# Patient Record
Sex: Male | Born: 1935
Health system: Southern US, Community
[De-identification: ages and names within clinical notes are randomized; demographics above are authoritative.]

---

## 2012-01-06 ENCOUNTER — Emergency Department: Payer: Self-pay | Admitting: Emergency Medicine

## 2012-07-02 ENCOUNTER — Observation Stay: Payer: Self-pay | Admitting: Internal Medicine

## 2012-07-02 LAB — BASIC METABOLIC PANEL
Calcium, Total: 9 mg/dL (ref 8.5–10.1)
Chloride: 106 mmol/L (ref 98–107)
Co2: 25 mmol/L (ref 21–32)
Creatinine: 0.84 mg/dL (ref 0.60–1.30)
Potassium: 3.5 mmol/L (ref 3.5–5.1)
Sodium: 141 mmol/L (ref 136–145)

## 2012-07-02 LAB — CBC
HCT: 36.6 % — ABNORMAL LOW (ref 40.0–52.0)
HGB: 12 g/dL — ABNORMAL LOW (ref 13.0–18.0)
MCH: 31.1 pg (ref 26.0–34.0)
MCHC: 32.8 g/dL (ref 32.0–36.0)
MCV: 95 fL (ref 80–100)
Platelet: 163 10*3/uL (ref 150–440)
RBC: 3.85 10*6/uL — ABNORMAL LOW (ref 4.40–5.90)
RDW: 13.4 % (ref 11.5–14.5)
WBC: 8 10*3/uL (ref 3.8–10.6)

## 2012-07-02 LAB — TROPONIN I: Troponin-I: 0.02 ng/mL

## 2012-07-03 LAB — CK TOTAL AND CKMB (NOT AT ARMC)
CK, Total: 61 U/L (ref 35–232)
CK, Total: 72 U/L (ref 35–232)
CK-MB: 1.7 ng/mL (ref 0.5–3.6)

## 2012-07-03 LAB — CBC WITH DIFFERENTIAL/PLATELET
Basophil %: 0.8 %
Eosinophil %: 5.9 %
MCHC: 33.9 g/dL (ref 32.0–36.0)
Monocyte %: 8.4 %
Neutrophil %: 58.5 %
Platelet: 141 10*3/uL — ABNORMAL LOW (ref 150–440)
RBC: 3.44 10*6/uL — ABNORMAL LOW (ref 4.40–5.90)
RDW: 13 % (ref 11.5–14.5)

## 2012-07-03 LAB — BASIC METABOLIC PANEL
Anion Gap: 6 — ABNORMAL LOW (ref 7–16)
BUN: 20 mg/dL — ABNORMAL HIGH (ref 7–18)
Calcium, Total: 8.2 mg/dL — ABNORMAL LOW (ref 8.5–10.1)
Chloride: 110 mmol/L — ABNORMAL HIGH (ref 98–107)
Co2: 28 mmol/L (ref 21–32)
Creatinine: 0.89 mg/dL (ref 0.60–1.30)
EGFR (African American): >60
Osmolality: 289 (ref 275–301)
Potassium: 3.3 mmol/L — ABNORMAL LOW (ref 3.5–5.1)

## 2012-07-03 LAB — TROPONIN I: Troponin-I: 0.03 ng/mL

## 2014-05-01 ENCOUNTER — Emergency Department: Payer: Self-pay | Admitting: Emergency Medicine

## 2014-05-06 DIAGNOSIS — I951 Orthostatic hypotension: Secondary | ICD-10-CM

## 2014-05-06 DIAGNOSIS — F39 Unspecified mood [affective] disorder: Secondary | ICD-10-CM

## 2014-05-06 DIAGNOSIS — F0150 Vascular dementia without behavioral disturbance: Secondary | ICD-10-CM

## 2014-05-06 DIAGNOSIS — M8448XA Pathological fracture, other site, initial encounter for fracture: Secondary | ICD-10-CM

## 2014-05-06 DIAGNOSIS — E039 Hypothyroidism, unspecified: Secondary | ICD-10-CM

## 2014-05-06 DIAGNOSIS — I2510 Atherosclerotic heart disease of native coronary artery without angina pectoris: Secondary | ICD-10-CM

## 2014-05-26 DIAGNOSIS — F323 Major depressive disorder, single episode, severe with psychotic features: Secondary | ICD-10-CM

## 2014-06-03 DIAGNOSIS — I951 Orthostatic hypotension: Secondary | ICD-10-CM

## 2014-06-03 DIAGNOSIS — S32009A Unspecified fracture of unspecified lumbar vertebra, initial encounter for closed fracture: Secondary | ICD-10-CM

## 2014-06-03 DIAGNOSIS — I25119 Atherosclerotic heart disease of native coronary artery with unspecified angina pectoris: Secondary | ICD-10-CM

## 2014-06-03 DIAGNOSIS — F0390 Unspecified dementia without behavioral disturbance: Secondary | ICD-10-CM

## 2014-06-03 DIAGNOSIS — I10 Essential (primary) hypertension: Secondary | ICD-10-CM

## 2014-06-03 DIAGNOSIS — F39 Unspecified mood [affective] disorder: Secondary | ICD-10-CM

## 2014-06-16 DIAGNOSIS — F331 Major depressive disorder, recurrent, moderate: Secondary | ICD-10-CM

## 2014-06-16 DIAGNOSIS — R634 Abnormal weight loss: Secondary | ICD-10-CM

## 2014-07-02 DIAGNOSIS — I951 Orthostatic hypotension: Secondary | ICD-10-CM

## 2014-07-02 DIAGNOSIS — I10 Essential (primary) hypertension: Secondary | ICD-10-CM

## 2014-07-02 DIAGNOSIS — E039 Hypothyroidism, unspecified: Secondary | ICD-10-CM

## 2014-07-02 DIAGNOSIS — I2510 Atherosclerotic heart disease of native coronary artery without angina pectoris: Secondary | ICD-10-CM

## 2014-07-02 DIAGNOSIS — F0150 Vascular dementia without behavioral disturbance: Secondary | ICD-10-CM

## 2014-07-02 DIAGNOSIS — F508 Other eating disorders: Secondary | ICD-10-CM

## 2014-07-02 DIAGNOSIS — S32009A Unspecified fracture of unspecified lumbar vertebra, initial encounter for closed fracture: Secondary | ICD-10-CM

## 2014-07-02 DIAGNOSIS — F323 Major depressive disorder, single episode, severe with psychotic features: Secondary | ICD-10-CM

## 2014-07-09 ENCOUNTER — Telehealth: Payer: Self-pay

## 2014-07-09 NOTE — Telephone Encounter (Signed)
Mrs James Griffin has questions about pt's medications and I transferred call to memory care [phone] and Mrs James Griffin is [facility] with [facility].

## 2014-07-28 DIAGNOSIS — H1013 Acute atopic conjunctivitis, bilateral: Secondary | ICD-10-CM

## 2014-08-02 ENCOUNTER — Telehealth: Payer: Self-pay

## 2014-08-02 NOTE — Telephone Encounter (Signed)
Dr Alphonsus Siasletvak signed Rx and has been faxed

## 2014-08-02 NOTE — Telephone Encounter (Signed)
Ok to change

## 2014-08-02 NOTE — Telephone Encounter (Signed)
James Griffin with [employer] left v/m; pt is at [facility] and has been taking ritalin 10 mg taking 2 tabs po bid. [employer] ins will not approve quantity and request Nicki Reaperegina Baity NP change to Ritalin 20 mg taking one tab po bid.Please advise.

## 2014-08-05 ENCOUNTER — Other Ambulatory Visit: Payer: Self-pay | Admitting: Internal Medicine

## 2014-09-08 DIAGNOSIS — F323 Major depressive disorder, single episode, severe with psychotic features: Secondary | ICD-10-CM | POA: Diagnosis not present

## 2014-09-08 DIAGNOSIS — E785 Hyperlipidemia, unspecified: Secondary | ICD-10-CM

## 2014-09-08 DIAGNOSIS — E039 Hypothyroidism, unspecified: Secondary | ICD-10-CM | POA: Diagnosis not present

## 2014-09-08 DIAGNOSIS — F508 Other eating disorders: Secondary | ICD-10-CM

## 2014-09-08 DIAGNOSIS — I951 Orthostatic hypotension: Secondary | ICD-10-CM

## 2014-09-08 DIAGNOSIS — F0150 Vascular dementia without behavioral disturbance: Secondary | ICD-10-CM

## 2014-09-08 DIAGNOSIS — I2510 Atherosclerotic heart disease of native coronary artery without angina pectoris: Secondary | ICD-10-CM | POA: Diagnosis not present

## 2014-09-08 DIAGNOSIS — I10 Essential (primary) hypertension: Secondary | ICD-10-CM | POA: Diagnosis not present

## 2014-09-21 ENCOUNTER — Ambulatory Visit: Payer: Self-pay | Admitting: Internal Medicine

## 2014-10-27 DIAGNOSIS — I10 Essential (primary) hypertension: Secondary | ICD-10-CM | POA: Diagnosis not present

## 2014-10-27 DIAGNOSIS — S32000S Wedge compression fracture of unspecified lumbar vertebra, sequela: Secondary | ICD-10-CM | POA: Diagnosis not present

## 2014-10-27 DIAGNOSIS — E039 Hypothyroidism, unspecified: Secondary | ICD-10-CM | POA: Diagnosis not present

## 2014-10-27 DIAGNOSIS — E785 Hyperlipidemia, unspecified: Secondary | ICD-10-CM

## 2014-10-27 DIAGNOSIS — F0150 Vascular dementia without behavioral disturbance: Secondary | ICD-10-CM

## 2014-10-27 DIAGNOSIS — F508 Other eating disorders: Secondary | ICD-10-CM

## 2014-10-27 DIAGNOSIS — I2510 Atherosclerotic heart disease of native coronary artery without angina pectoris: Secondary | ICD-10-CM | POA: Diagnosis not present

## 2014-11-03 DIAGNOSIS — R509 Fever, unspecified: Secondary | ICD-10-CM | POA: Diagnosis not present

## 2014-11-03 DIAGNOSIS — R05 Cough: Secondary | ICD-10-CM | POA: Diagnosis not present

## 2014-11-03 DIAGNOSIS — R062 Wheezing: Secondary | ICD-10-CM | POA: Diagnosis not present

## 2014-11-05 DIAGNOSIS — R05 Cough: Secondary | ICD-10-CM | POA: Diagnosis not present

## 2014-11-05 DIAGNOSIS — R062 Wheezing: Secondary | ICD-10-CM | POA: Diagnosis not present

## 2014-11-09 ENCOUNTER — Inpatient Hospital Stay: Payer: Self-pay | Admitting: Specialist

## 2014-11-09 DIAGNOSIS — J209 Acute bronchitis, unspecified: Secondary | ICD-10-CM | POA: Diagnosis not present

## 2014-11-09 DIAGNOSIS — R05 Cough: Secondary | ICD-10-CM | POA: Diagnosis not present

## 2014-11-09 DIAGNOSIS — I129 Hypertensive chronic kidney disease with stage 1 through stage 4 chronic kidney disease, or unspecified chronic kidney disease: Secondary | ICD-10-CM | POA: Diagnosis not present

## 2014-11-09 DIAGNOSIS — R0602 Shortness of breath: Secondary | ICD-10-CM | POA: Diagnosis not present

## 2014-11-09 DIAGNOSIS — Z66 Do not resuscitate: Secondary | ICD-10-CM | POA: Diagnosis not present

## 2014-11-09 DIAGNOSIS — I248 Other forms of acute ischemic heart disease: Secondary | ICD-10-CM | POA: Diagnosis not present

## 2014-11-09 DIAGNOSIS — I214 Non-ST elevation (NSTEMI) myocardial infarction: Secondary | ICD-10-CM | POA: Diagnosis not present

## 2014-11-09 DIAGNOSIS — N189 Chronic kidney disease, unspecified: Secondary | ICD-10-CM | POA: Diagnosis not present

## 2014-11-09 DIAGNOSIS — N179 Acute kidney failure, unspecified: Secondary | ICD-10-CM | POA: Diagnosis not present

## 2014-11-09 DIAGNOSIS — J189 Pneumonia, unspecified organism: Secondary | ICD-10-CM | POA: Diagnosis not present

## 2014-11-09 DIAGNOSIS — N289 Disorder of kidney and ureter, unspecified: Secondary | ICD-10-CM | POA: Diagnosis not present

## 2014-11-09 DIAGNOSIS — R7989 Other specified abnormal findings of blood chemistry: Secondary | ICD-10-CM | POA: Diagnosis not present

## 2014-11-09 DIAGNOSIS — J9601 Acute respiratory failure with hypoxia: Secondary | ICD-10-CM | POA: Diagnosis not present

## 2014-11-09 DIAGNOSIS — I10 Essential (primary) hypertension: Secondary | ICD-10-CM | POA: Diagnosis not present

## 2014-11-09 DIAGNOSIS — R509 Fever, unspecified: Secondary | ICD-10-CM | POA: Diagnosis not present

## 2014-11-09 DIAGNOSIS — F0390 Unspecified dementia without behavioral disturbance: Secondary | ICD-10-CM | POA: Diagnosis not present

## 2014-11-09 DIAGNOSIS — E860 Dehydration: Secondary | ICD-10-CM | POA: Diagnosis not present

## 2014-11-11 LAB — CREATININE, SERUM
Creatinine: 0.93 mg/dL
EGFR (Non-African Amer.): >60

## 2014-11-12 LAB — CREATININE, SERUM
Creatinine: 0.76 mg/dL
EGFR (African American): >60

## 2014-11-16 LAB — CULTURE, BLOOD (SINGLE)

## 2014-11-19 DIAGNOSIS — J189 Pneumonia, unspecified organism: Secondary | ICD-10-CM | POA: Diagnosis not present

## 2014-11-19 DIAGNOSIS — J9601 Acute respiratory failure with hypoxia: Secondary | ICD-10-CM | POA: Diagnosis not present

## 2014-11-19 DIAGNOSIS — K219 Gastro-esophageal reflux disease without esophagitis: Secondary | ICD-10-CM | POA: Diagnosis not present

## 2014-11-19 DIAGNOSIS — I10 Essential (primary) hypertension: Secondary | ICD-10-CM | POA: Diagnosis not present

## 2014-11-19 DIAGNOSIS — I2510 Atherosclerotic heart disease of native coronary artery without angina pectoris: Secondary | ICD-10-CM | POA: Diagnosis not present

## 2014-11-22 DIAGNOSIS — J189 Pneumonia, unspecified organism: Secondary | ICD-10-CM | POA: Diagnosis not present

## 2014-11-22 DIAGNOSIS — K219 Gastro-esophageal reflux disease without esophagitis: Secondary | ICD-10-CM | POA: Diagnosis not present

## 2014-11-22 DIAGNOSIS — J9601 Acute respiratory failure with hypoxia: Secondary | ICD-10-CM | POA: Diagnosis not present

## 2014-11-22 DIAGNOSIS — I2510 Atherosclerotic heart disease of native coronary artery without angina pectoris: Secondary | ICD-10-CM | POA: Diagnosis not present

## 2014-11-22 DIAGNOSIS — I10 Essential (primary) hypertension: Secondary | ICD-10-CM | POA: Diagnosis not present

## 2014-11-23 DIAGNOSIS — I2510 Atherosclerotic heart disease of native coronary artery without angina pectoris: Secondary | ICD-10-CM | POA: Diagnosis not present

## 2014-11-23 DIAGNOSIS — I10 Essential (primary) hypertension: Secondary | ICD-10-CM | POA: Diagnosis not present

## 2014-11-23 DIAGNOSIS — K219 Gastro-esophageal reflux disease without esophagitis: Secondary | ICD-10-CM | POA: Diagnosis not present

## 2014-11-23 DIAGNOSIS — J189 Pneumonia, unspecified organism: Secondary | ICD-10-CM | POA: Diagnosis not present

## 2014-11-23 DIAGNOSIS — J9601 Acute respiratory failure with hypoxia: Secondary | ICD-10-CM | POA: Diagnosis not present

## 2014-11-25 DIAGNOSIS — K219 Gastro-esophageal reflux disease without esophagitis: Secondary | ICD-10-CM | POA: Diagnosis not present

## 2014-11-25 DIAGNOSIS — I2510 Atherosclerotic heart disease of native coronary artery without angina pectoris: Secondary | ICD-10-CM | POA: Diagnosis not present

## 2014-11-25 DIAGNOSIS — J189 Pneumonia, unspecified organism: Secondary | ICD-10-CM | POA: Diagnosis not present

## 2014-11-25 DIAGNOSIS — J9601 Acute respiratory failure with hypoxia: Secondary | ICD-10-CM | POA: Diagnosis not present

## 2014-11-25 DIAGNOSIS — I10 Essential (primary) hypertension: Secondary | ICD-10-CM | POA: Diagnosis not present

## 2014-11-26 DIAGNOSIS — J9601 Acute respiratory failure with hypoxia: Secondary | ICD-10-CM | POA: Diagnosis not present

## 2014-11-26 DIAGNOSIS — K219 Gastro-esophageal reflux disease without esophagitis: Secondary | ICD-10-CM | POA: Diagnosis not present

## 2014-11-26 DIAGNOSIS — I2510 Atherosclerotic heart disease of native coronary artery without angina pectoris: Secondary | ICD-10-CM | POA: Diagnosis not present

## 2014-11-26 DIAGNOSIS — J189 Pneumonia, unspecified organism: Secondary | ICD-10-CM | POA: Diagnosis not present

## 2014-11-26 DIAGNOSIS — I10 Essential (primary) hypertension: Secondary | ICD-10-CM | POA: Diagnosis not present

## 2014-11-29 DIAGNOSIS — J9601 Acute respiratory failure with hypoxia: Secondary | ICD-10-CM | POA: Diagnosis not present

## 2014-11-29 DIAGNOSIS — J189 Pneumonia, unspecified organism: Secondary | ICD-10-CM | POA: Diagnosis not present

## 2014-11-29 DIAGNOSIS — K219 Gastro-esophageal reflux disease without esophagitis: Secondary | ICD-10-CM | POA: Diagnosis not present

## 2014-11-29 DIAGNOSIS — I10 Essential (primary) hypertension: Secondary | ICD-10-CM | POA: Diagnosis not present

## 2014-11-29 DIAGNOSIS — I2510 Atherosclerotic heart disease of native coronary artery without angina pectoris: Secondary | ICD-10-CM | POA: Diagnosis not present

## 2014-12-01 DIAGNOSIS — J189 Pneumonia, unspecified organism: Secondary | ICD-10-CM | POA: Diagnosis not present

## 2014-12-01 DIAGNOSIS — J9601 Acute respiratory failure with hypoxia: Secondary | ICD-10-CM | POA: Diagnosis not present

## 2014-12-01 DIAGNOSIS — I2510 Atherosclerotic heart disease of native coronary artery without angina pectoris: Secondary | ICD-10-CM | POA: Diagnosis not present

## 2014-12-01 DIAGNOSIS — K219 Gastro-esophageal reflux disease without esophagitis: Secondary | ICD-10-CM | POA: Diagnosis not present

## 2014-12-01 DIAGNOSIS — I10 Essential (primary) hypertension: Secondary | ICD-10-CM | POA: Diagnosis not present

## 2014-12-02 DIAGNOSIS — I2510 Atherosclerotic heart disease of native coronary artery without angina pectoris: Secondary | ICD-10-CM | POA: Diagnosis not present

## 2014-12-02 DIAGNOSIS — K219 Gastro-esophageal reflux disease without esophagitis: Secondary | ICD-10-CM | POA: Diagnosis not present

## 2014-12-02 DIAGNOSIS — J189 Pneumonia, unspecified organism: Secondary | ICD-10-CM | POA: Diagnosis not present

## 2014-12-02 DIAGNOSIS — J9601 Acute respiratory failure with hypoxia: Secondary | ICD-10-CM | POA: Diagnosis not present

## 2014-12-02 DIAGNOSIS — I10 Essential (primary) hypertension: Secondary | ICD-10-CM | POA: Diagnosis not present

## 2014-12-06 DIAGNOSIS — J9601 Acute respiratory failure with hypoxia: Secondary | ICD-10-CM | POA: Diagnosis not present

## 2014-12-06 DIAGNOSIS — K219 Gastro-esophageal reflux disease without esophagitis: Secondary | ICD-10-CM | POA: Diagnosis not present

## 2014-12-06 DIAGNOSIS — I2510 Atherosclerotic heart disease of native coronary artery without angina pectoris: Secondary | ICD-10-CM | POA: Diagnosis not present

## 2014-12-06 DIAGNOSIS — J189 Pneumonia, unspecified organism: Secondary | ICD-10-CM | POA: Diagnosis not present

## 2014-12-06 DIAGNOSIS — I10 Essential (primary) hypertension: Secondary | ICD-10-CM | POA: Diagnosis not present

## 2014-12-07 NOTE — H&P (Signed)
PATIENT NAME:  James Griffin, James Griffin MR#:  [id number] DATE OF BIRTH:  Dec 04, 1921  DATE OF ADMISSION:  07/02/2012  PRIMARY DOCTOR: Dr. Einar Crow   CHIEF COMPLAINT: Chest pain.   ER PHYSICIAN: Dr. Glenetta Hew    HISTORY OF PRESENT ILLNESS: The patient is an 81 year old male with history of hypertension, coronary artery disease, history of CABG in 199, and also history of dementia who was brought in for chest pain. The patient started to have chest pain since this morning mainly in the left side radiating to the left arm. Did not have any nausea or vomiting. No diaphoresis. No cough. No fever. Chest pain was relieved only partially with morphine and nitroglycerin. The patient complains of back pain as well in the upper back around the scapular area. The patient's blood pressure in the Emergency Room was 211/91 a  PAST MEDICAL HISTORY:  1. Hypertension. 2. Dementia. 3. Hyperlipidemia. 4. History of coronary artery disease. 5. Hypothyroidism.   ALLERGIES: No allergies.   SOCIAL HISTORY: No smoking. No drinking. No drugs.   FAMILY HISTORY: The patient is an only child and has no family history of hypertension or diabetes.   MEDICATIONS:  1. Donepezil 10 mg p.o. daily. 2. Fludrocortisone 0.1 mg p.o. daily.  3. Levothyroxine 100 mcg p.o. daily.  4. Namenda 10 mg p.o. b.i.d.  5. Omeprazole 20 mg 1 tablet p.o. daily.  6. Ramipril 5 mg daily. 7. Simvastatin 40 mg p.o. daily.   PAST SURGICAL HISTORY:  1. Tonsillectomy.  2. History of bypass surgery.   REVIEW OF SYSTEMS: CONSTITUTIONAL: Has no fever. No fatigue. EYES: No blurred vision. ENT: No tinnitus. No epistaxis. No difficulty swallowing. RESPIRATORY: No cough. No wheezing. CARDIOVASCULAR: Chest pain on the left side since this morning. No orthopnea, no PND, no palpitations, no syncope. GI: No nausea, no vomiting, no abdominal pain. GU: No dysuria. ENDOCRINE: No polyuria or nocturia. Has thyroid problems. HEMATOLOGIC: No anemia or easy  bruising. MUSCULOSKELETAL: No joint pain. NEUROLOGIC: No numbness or weakness. PSYCHIATRIC: Has dementia.   PHYSICAL EXAMINATION:   VITAL SIGNS: Temperature 98.2, pulse 60, respirations 18, blood pressure 211/91.  GENERAL: Elderly male answering questions appropriately.   HEENT: Head atraumatic, normocephalic. Pupils equally reacting to light. Extraocular movements intact.   ENT: No tympanic membrane congestion. No turbinate hypertrophy. No oropharyngeal erythema.   NECK: Normal range of motion. No JVD. No carotid bruit.   CARDIOVASCULAR: S1, S2 regular. No murmurs. Good pedal pulses and femoral pulses. No extremity edema.   ABDOMEN: Soft, nontender, nondistended. Bowel sounds present. No hernias. No CVA tenderness.   MUSCULOSKELETAL: Strength 5/5 upper and lower extremities. Gait is not tested.    SKIN: No skin rashes.   NEUROLOGIC: Cranial nerves II through XII are intact. Deep tendon reflexes 2+ bilaterally. Sensation intact. No dysarthria or aphasia.    LYMPH: No lymphadenopathy in cervical or axillary region.   PSYCH: Oriented to time, place, and person.   LABORATORY, DIAGNOSTIC, AND RADIOLOGICAL DATA: Electrolytes sodium 141, potassium 3.5, chloride 106, bicarb 25, BUN 21, creatinine 0.84, glucose 93, WBC 8, hemoglobin 12, hematocrit 36.6, platelets 163. Troponin 0.02.   Chest x-ray shows no acute disease of the chest.   ASSESSMENT AND PLAN:  1. This is an 81 year old male with chest pain and uncontrolled hypertension. Has history of coronary artery disease. He will be admitted for observation. Cycle the troponins. EKG in the morning. Continue aspirin, beta-blockers, statins, nitroglycerin. Increase the lisinopril to 10 mg daily.  2. Malignant hypertension. The patient's  blood pressure fluctuates according to the wife. She says it fluctuates. We are going to start beta-blockers and also ACE inhibitors and nitro paste and see how he does on the monitor. If needed, we can add  hydralazine for blood pressure.  3. Hypothyroidism. Continue Synthroid. 4. Dementia. Continue Namenda.  TIME SPENT ON HISTORY AND PHYSICAL: About 55 minutes. [facility] the [facility].  ____________________________ Katha HammingSnehalatha Remy Dia, MD sk:drc D: 07/02/2012 20:30:14 ET T: 07/03/2012 07:57:15 ET JOB#: [id number]  cc: Katha HammingSnehalatha Desirie Minteer, MD, <Dictator> Marya AmslerMarshall W. Dareen PianoAnderson, MD Katha HammingSNEHALATHA Karne Ozga MD ELECTRONICALLY SIGNED 08/05/2012 14:51

## 2014-12-10 DIAGNOSIS — I2510 Atherosclerotic heart disease of native coronary artery without angina pectoris: Secondary | ICD-10-CM | POA: Diagnosis not present

## 2014-12-10 DIAGNOSIS — I10 Essential (primary) hypertension: Secondary | ICD-10-CM | POA: Diagnosis not present

## 2014-12-10 DIAGNOSIS — J189 Pneumonia, unspecified organism: Secondary | ICD-10-CM | POA: Diagnosis not present

## 2014-12-10 DIAGNOSIS — J9601 Acute respiratory failure with hypoxia: Secondary | ICD-10-CM | POA: Diagnosis not present

## 2014-12-10 DIAGNOSIS — K219 Gastro-esophageal reflux disease without esophagitis: Secondary | ICD-10-CM | POA: Diagnosis not present

## 2014-12-13 DIAGNOSIS — J189 Pneumonia, unspecified organism: Secondary | ICD-10-CM | POA: Diagnosis not present

## 2014-12-13 DIAGNOSIS — J9601 Acute respiratory failure with hypoxia: Secondary | ICD-10-CM | POA: Diagnosis not present

## 2014-12-13 DIAGNOSIS — I10 Essential (primary) hypertension: Secondary | ICD-10-CM | POA: Diagnosis not present

## 2014-12-13 DIAGNOSIS — I2510 Atherosclerotic heart disease of native coronary artery without angina pectoris: Secondary | ICD-10-CM | POA: Diagnosis not present

## 2014-12-13 DIAGNOSIS — K219 Gastro-esophageal reflux disease without esophagitis: Secondary | ICD-10-CM | POA: Diagnosis not present

## 2014-12-16 DIAGNOSIS — J189 Pneumonia, unspecified organism: Secondary | ICD-10-CM | POA: Diagnosis not present

## 2014-12-16 DIAGNOSIS — J9601 Acute respiratory failure with hypoxia: Secondary | ICD-10-CM | POA: Diagnosis not present

## 2014-12-16 DIAGNOSIS — I2510 Atherosclerotic heart disease of native coronary artery without angina pectoris: Secondary | ICD-10-CM | POA: Diagnosis not present

## 2014-12-16 DIAGNOSIS — I10 Essential (primary) hypertension: Secondary | ICD-10-CM | POA: Diagnosis not present

## 2014-12-16 DIAGNOSIS — K219 Gastro-esophageal reflux disease without esophagitis: Secondary | ICD-10-CM | POA: Diagnosis not present

## 2014-12-19 NOTE — Discharge Summary (Signed)
PATIENT NAME:  James Griffin, James Griffin MR#:  [id number] DATE OF BIRTH:  [birth year]  DATE OF ADMISSION:  11/09/2014 DATE OF DISCHARGE:  11/15/2014  PRESENTING COMPLAICharlynn Griffin:  Cough and shortness of breath.  DISCHARGE DIAGNOSES: 1.  Acute hypoxic respiratory failure due to right lower lobe pneumonia. 2.  Acute renal failure, resolved. 3.  Hypertension. 4.  Hyperlipidemia. 5.  Saturations of 92% on room air.  CODE STATUS:  NO CODE/DO NOT RESUSCITATE.  DISCHARGE MEDICATIONS: 1.  Namenda 10 mg p.o. b.i.d.  2.  Levothyroxine 100 mcg p.o. daily. 3.  Donepezil 10 mg at bedtime. 4.  Simvastatin 40 mg p.o. daily at bedtime. 5.  Aspirin 81 mg p.o. daily. 6.  Percocet 5/325 mg 1 every 8 hours as needed. 7.  Claritin 10 mg p.o. daily as needed. 8.  Albuterol 3 mL every 4 hours.  9.  Tylenol 650 extended release p.o. every 8 hours as needed. 10.  MiraLax 17 grams every other day. 11.  Citalopram 10 mg at bedtime. 12.  Remeron 7.5 mg at bedtime. 13.  Ritalin 20 mg p.o. b.i.d.  14.  Robitussin 10 mL every 4 hours. 15.  Vitamin D3, 50,000 international units 1 p.o. once a month. 16.  Ramipril 10 mg daily. 17.  Ceftin 500 mg b.i.d. 18.  Doxycycline 100 mg b.i.d. 19.  Ensure 1 can b.i.d.  [facility]  DIET:  Mechanical, soft, thin liquids, no straw, strict aspiration precaution.   LABORATORY DATA:  Creatinine was 0.76. Blood cultures negative at 48 hours. Influenza A plus B was negative. White count was 8.0, hemoglobin and hematocrit 11.8 and 37.6.   HOSPITAL COURSE:  Mr. Wallene DalesSeel is a 81 year old Caucasian gentleman who comes [facility] with history of dementia and hypertension, who comes in with:  1.  Acute respiratory failure with hypoxia due to right lower lobe pneumonia. She has received ceftriaxone and doxycyline changed to p.o. cefuroxime and doxycycline. The patient received incentive spirometer. His saturations were 92% on room air.  2.  Acute renal failure likely due to  dehydration.  Creatinine was 2.1. At discharge after IV fluids, creatinine was 0.93.  3.  Elevated troponin was likely demand ischemia. No chest pain. Continued aspirin and statin. 4.  Dementia. Continued Aricept and Namenda.  5.  Hyperlipidemia, on simvastatin.  6.  Depression. Continued Celexa. 7.  Code status.  NO CODE/DO NOT RESUSCITATE. 8.  The [facility].   TIME SPENT:  40 minutes.    ____________________________ Wylie HailSona A. Allena KatzPatel, MD sap:nb D: 11/15/2014 16:02:13 ET T: 11/16/2014 05:26:02 ET JOB#: [id number]  cc: Veralyn Lopp A. Allena KatzPatel, MD, <Dictator> Willow OraSONA A Gotham Raden MD ELECTRONICALLY SIGNED 11/23/2014 15:56

## 2014-12-19 NOTE — Consult Note (Signed)
Present Illness 81 year old male  who presents from a skilled nursing facility due to shortness of breath. The patient stated he had a cough productive of brown sputum for  3 days. Also complains of some chills, but no  fever and also shortness of breath. His symptoms  worsened any was noted to have pulse oximetry in the 80s.  He was brought to the emergency room where he was noted to be E somewhat volume depleted as well as hypoxic.  He was hydrated is had some improvement.  His shortness of breath improved.  His renal function also improved somewhat.  His serum troponin was mildly elevated at 0.23.  He denies any current chest pain.  Electrocardiogram reveals sinus rhythm with T-wave inversions in the lateral leads.  There were no obvious ischemic changes.  Echocardiogram revealed normal LV function with no significant structural valvular abnormalities. patient has prior cardiac history status post Coronary artery bypass grafting.   Physical Exam:  GEN no acute distress   HEENT hearing intact to voice, moist oral mucosa   NECK No masses   RESP clear BS   CARD Regular rate and rhythm   ABD denies tenderness  normal BS   LYMPH negative neck, negative axillae   EXTR negative cyanosis/clubbing, negative edema   SKIN normal to palpation   NEURO cranial nerves intact, motor/sensory function intact   PSYCH A+O to time, place, person   Review of Systems:  Subjective/Chief Complaint Shortness of breath   General: Fatigue   Skin: No Complaints   ENT: No Complaints   Eyes: No Complaints   Neck: No Complaints   Respiratory: Short of breath   Cardiovascular: No Complaints   Gastrointestinal: No Complaints   Genitourinary: No Complaints   Vascular: No Complaints   Musculoskeletal: No Complaints   Neurologic: No Complaints   Hematologic: No Complaints   Endocrine: No Complaints   Psychiatric: No Complaints   Review of Systems: All other systems were reviewed and found  to be negative   Medications/Allergies Reviewed Medications/Allergies reviewed    Penicillin: Unknown   Impression 81 year old male with history of coronary artery disease status post coronary artery bypass grafting in the past 2 was no admitted after being noted to be somewhat more lethargic and shortness of breath with evidence of hypoxia had his skilled nursing facility.  In the facility, his pulse oximetry   Was in the 80s.  In the emergency room there were also below 90.  He was placed on oxygen.Marland Kitchen  His serum creatinine was 2.12 which was acute on chronic renal insufficiency.  He had a mild serum troponin elevation to 0.23.  Electrocardiogram showed no high-grade ischemia.  Patient had no and continues to have no chest pain at present.  He elevated serum troponin appears to be demand ischemia in the face of acute on chronic renal insufficiency.  He does not appear to have had an acute coronary event.  Would attempt to continue with medical management unless the symptoms change.  Patient would be a relative high risk for invasive evaluation with decreased GFR to around 30. echocardiogram revealed preserved LV function with no high-grade valvular abnormalities and no regional wall motion abnormality   Plan 1. Would continue with aspirin and simvastatin 2. Would defer invasive evaluation at present as these symptoms of appear to be nonischemic.  His seum  troponin appears to be demand ischemia in the face of hypoxia, relative tachycardia and acute on chronic renal insufficiency. 3. continue to treat  possible airspace disease with empiric antibiotics 4. Gentle hydration 5. Follow renal function 6. further recommendations depending on his course   Electronic Signatures: Dalia HeadingFath, Shabrea Weldin A (MD)  (Signed 23-Mar-16 16:15)  Authored: General Aspect/Present Illness, History and Physical Exam, Review of System, Allergies, Impression/Plan   Last Updated: 23-Mar-16 16:15 by Dalia HeadingFath, Dessa Ledee A (MD)

## 2014-12-19 NOTE — H&P (Signed)
PATIENT NAME:  Charlynn GrimesSEEL, Thompson G MR#:  [id number] DATE OF BIRTH:  March 10, 1922  DATE OF ADMISSION:  11/09/2014  PRIMARY CARE PHYSICIAN:  Dr. Alphonsus SiasLetvak   CHIEF COMPLAINT: Shortness of breath.   HISTORY OF PRESENT ILLNESS: This is a 81 year old male who presents from a skilled nursing facility due to shortness of breath. The patient says he has had a cough productive of brown sputum for now 3 days. Also complains of some chills, but no documented fever and also shortness of breath. His symptoms are not improving, and at the skilled nursing facility, he was noted to be hypoxic with oxygen saturations in the 80s. He was, therefore, sent to the ER for further evaluation. The patient presented to the Emergency Room and was still noted to be hypoxic. On routine labs, he was also noted to be in acute renal failure and noted to have an elevated troponin. Hospitalist services were contacted for further treatment and evaluation. The patient denies any chest pain, any nausea, vomiting, diaphoresis, palpitations, abdominal pain, diarrhea, or any other associated symptoms presently.   REVIEW OF SYSTEMS:  CONSTITUTIONAL: No documented fever. No weight gain, no weight loss.  EYES: No blurry or double vision.  ENT: No tinnitus. No postnasal drip. No redness of the oropharynx.  RESPIRATORY: Positive cough, no wheeze, no hemoptysis. Positive dyspnea.  CARDIOVASCULAR: No chest pain. No orthopnea. No palpitation or syncope.  GASTROINTESTINAL: No nausea, no vomiting or diarrhea. No abdominal pain. No melena or hematochezia.  GENITOURINARY: No dysuria or hematuria.  ENDOCRINE: No polyuria or nocturia. No heat or cold intolerance.  HEMATOLOGIC: No anemia. No bruising or bleeding.  INTEGUMENTARY: No rashes. No lesions.  MUSCULOSKELETAL: No arthritis, no swelling, no gout.  NEUROLOGIC: No numbness or tingling. No ataxia. No seizure-type activity.  PSYCHIATRIC: Positive dementia. Positive depression. No ADD.   PAST MEDICAL  HISTORY: Consistent with coronary artery disease status post bypass, dementia, hypertension, hyperlipidemia, depression.   ALLERGIES: THE PATIENT DOES HAVE AN ALLERGY TO PENICILLIN WHICH CAUSES A RASH.   SOCIAL HISTORY: Used to be a smoker, quit about 20-30 years ago. Does have a 20-30 pack year smoking history. No alcohol abuse. No illicit drug abuse.  Resides at a skilled nursing facility.   FAMILY HISTORY: The patient's mother and father are both died from old age. Father had Parkinson's.   PHYSICAL EXAMINATION: Presently is as follows:  VITAL SIGNS: The patient's vital signs are noted to be:  Temperature 98.3, pulse 81, respirations 20, blood pressure 93/51, saturation is 84% on room air.  GENERAL: He is a pleasant appearing male but in no apparent distress.  HEAD, EYES, EARS, NOSE AND THROAT: Atraumatic, normocephalic. Extraocular muscles are intact. Pupils equally round to light. Sclerae is anicteric. No conjunctival injection. No pharyngeal erythema.  NECK: Supple. There is no jugular venous distention. No bruits. No lymphadenopathy or thyromegaly.  HEART: Regular rate and rhythm. No murmurs, no rubs, no clicks.  LUNGS: He has some coarse rhonchi at the bases bilaterally.  Negative use of accessory muscles. No dullness to percussion.  ABDOMEN: Soft, flat, nontender, nondistended. Has good bowel sounds. No hepatosplenomegaly appreciated.  EXTREMITIES: No evidence of any cyanosis, clubbing, or peripheral edema. Has +2 pedal and radial pulses bilaterally.  NEUROLOGIC: The patient is alert, awake, and oriented x 2.  Moves all extremities spontaneously. No other focal or motor sensory deficits bilaterally.  SKIN: Moist and warm with no rashes.  LYMPHATIC: There is no cervical or axillary lymphadenopathy.   LABORATORY DATA: Showed a  serum glucose of 163, BUN 72, creatinine 2.15, sodium 145, potassium 3.6, chloride 110, bicarbonate 22. The patient's troponin 0.23. White cell count 8, hemoglobin  11.8, hematocrit 37.6, platelet count of 139,000.  INR is 1.1. The patient did have a chest x-ray done which showed no acute abnormalities, chronic scarring of the left lung base.   ASSESSMENT AND PLAN: This is a 81 year old male with history of coronary artery disease status post bypass, dementia, hypertension, hyperlipidemia, who presents to the hospital from a skilled nursing facility with shortness of breath and cough and noted to be hypoxic and also noted to have an elevated troponin.  1. Acute respiratory failure with hypoxia. The exact etiology of this is unclear, but I suspect this is probably acute bronchitis.   We need to rule out possible non-ST-elevation myocardial versus pulmonary embolus too. His chest x-ray is negative for pneumonia. I cannot do a CT chest to rule out pulmonary embolus due to acute renal failure.  I will hydrate with IV fluids, consider doing a CT in the morning. For now, will get Dopplers of lower extremities, empirically start him on heparin and given empiric Levaquin for his bronchitis. Follow sputum cultures, place him on p.r.n. DuoNeb, continue antitussives.  We will also get a cardiology consult for his elevated troponin. I will repeat a chest x-ray in the morning.  2. Acute renal failure. This is likely due to dehydration and poor p.o. intake. I will hydrate him with IV fluids, follow BUN and creatinine, hold his ramipril for  now.  3. Elevated troponin. This is likely in the setting of demand ischemia.   Cannot rule out a non-ST-elevation myocardial infarction.   Acutely has no chest pain. He has no EKG changes. I will observe him on telemetry, follow serial cardiac markers, get a 2-dimensional echocardiogram, get a cardiology consult. Continue empiric heparin, aspirin and statin for now.  If his troponins do not trend upwards, I will discontinue his heparin drip.  4. Dementia. Continue Namenda and Aricept.  5. Hyperlipidemia. Continue simvastatin.  6. Hypertension.  The patient is presently hemodynamically stable. Hold his ramipril due to the acute renal failure.  7. Depression. Continue Celexa.   CODE STATUS: THE PATIENT IS A DO NOT INTUBATE/DO NOT RESUSCITATE.   TIME SPENT ON ADMISSION:  Is 50 minutes.    ____________________________ Rolly Pancake. Cherlynn Kaiser, MD vjs:tr D: 11/09/2014 11:55:08 ET T: 11/09/2014 12:15:25 ET JOB#: [id number]  cc: Rolly Pancake. Cherlynn Kaiser, MD, <Dictator> Houston Siren MD ELECTRONICALLY SIGNED 11/09/2014 17:29

## 2014-12-22 DIAGNOSIS — I2510 Atherosclerotic heart disease of native coronary artery without angina pectoris: Secondary | ICD-10-CM | POA: Diagnosis not present

## 2014-12-22 DIAGNOSIS — E785 Hyperlipidemia, unspecified: Secondary | ICD-10-CM

## 2014-12-22 DIAGNOSIS — F0150 Vascular dementia without behavioral disturbance: Secondary | ICD-10-CM

## 2014-12-22 DIAGNOSIS — E039 Hypothyroidism, unspecified: Secondary | ICD-10-CM | POA: Diagnosis not present

## 2014-12-22 DIAGNOSIS — I951 Orthostatic hypotension: Secondary | ICD-10-CM | POA: Diagnosis not present

## 2014-12-22 DIAGNOSIS — I10 Essential (primary) hypertension: Secondary | ICD-10-CM | POA: Diagnosis not present

## 2014-12-22 DIAGNOSIS — F508 Other eating disorders: Secondary | ICD-10-CM

## 2015-02-23 DIAGNOSIS — E785 Hyperlipidemia, unspecified: Secondary | ICD-10-CM

## 2015-02-23 DIAGNOSIS — G039 Meningitis, unspecified: Secondary | ICD-10-CM

## 2015-02-23 DIAGNOSIS — F0150 Vascular dementia without behavioral disturbance: Secondary | ICD-10-CM

## 2015-02-23 DIAGNOSIS — I2510 Atherosclerotic heart disease of native coronary artery without angina pectoris: Secondary | ICD-10-CM

## 2015-02-23 DIAGNOSIS — F508 Other eating disorders: Secondary | ICD-10-CM

## 2015-02-23 DIAGNOSIS — F4323 Adjustment disorder with mixed anxiety and depressed mood: Secondary | ICD-10-CM

## 2015-02-23 DIAGNOSIS — I10 Essential (primary) hypertension: Secondary | ICD-10-CM

## 2015-03-28 LAB — BASIC METABOLIC PANEL
Anion Gap: 5 — ABNORMAL LOW (ref 7–16)
BUN: 71 mg/dL — AB
CALCIUM: 7.9 mg/dL — AB
CHLORIDE: 113 mmol/L — AB
CREATININE: 1.59 mg/dL — AB
Co2: 24 mmol/L
EGFR (African American): 44 — ABNORMAL LOW
GFR CALC NON AF AMER: 38 — AB
Glucose: 92 mg/dL
Potassium: 3.4 mmol/L — ABNORMAL LOW
Sodium: 142 mmol/L

## 2015-03-28 LAB — CBC WITH DIFFERENTIAL/PLATELET
BASOS ABS: 0 10*3/uL (ref 0.0–0.1)
BASOS PCT: 0.1 %
Basophil #: 0 10*3/uL (ref 0.0–0.1)
Basophil %: 0.2 %
Eosinophil #: 0 10*3/uL (ref 0.0–0.7)
Eosinophil #: 0 10*3/uL (ref 0.0–0.7)
Eosinophil %: 0 %
Eosinophil %: 0 %
HCT: 29.1 % — ABNORMAL LOW (ref 40.0–52.0)
HCT: 31.9 % — ABNORMAL LOW (ref 40.0–52.0)
HGB: 10.1 g/dL — ABNORMAL LOW (ref 13.0–18.0)
HGB: 9.3 g/dL — ABNORMAL LOW (ref 13.0–18.0)
LYMPHS ABS: 0.8 10*3/uL — AB (ref 1.0–3.6)
LYMPHS PCT: 10.7 %
LYMPHS PCT: 9.7 %
Lymphocyte #: 0.8 10*3/uL — ABNORMAL LOW (ref 1.0–3.6)
MCH: 30.2 pg (ref 26.0–34.0)
MCH: 30.7 pg (ref 26.0–34.0)
MCHC: 31.7 g/dL — ABNORMAL LOW (ref 32.0–36.0)
MCHC: 32.1 g/dL (ref 32.0–36.0)
MCV: 95 fL (ref 80–100)
MCV: 96 fL (ref 80–100)
Monocyte #: 0.2 x10 3/mm (ref 0.2–1.0)
Monocyte #: 0.2 x10 3/mm (ref 0.2–1.0)
Monocyte %: 2.2 %
Monocyte %: 2.2 %
NEUTROS PCT: 88 %
Neutrophil #: 6.7 10*3/uL — ABNORMAL HIGH (ref 1.4–6.5)
Neutrophil #: 7.4 10*3/uL — ABNORMAL HIGH (ref 1.4–6.5)
Neutrophil %: 86.9 %
PLATELETS: 105 10*3/uL — AB (ref 150–440)
Platelet: 116 10*3/uL — ABNORMAL LOW (ref 150–440)
RBC: 3.04 10*6/uL — ABNORMAL LOW (ref 4.40–5.90)
RBC: 3.35 10*6/uL — ABNORMAL LOW (ref 4.40–5.90)
RDW: 13.4 % (ref 11.5–14.5)
RDW: 13.9 % (ref 11.5–14.5)
WBC: 7.7 10*3/uL (ref 3.8–10.6)
WBC: 8.4 10*3/uL (ref 3.8–10.6)

## 2015-03-28 LAB — HEPARIN LEVEL (UNFRACTIONATED)
ANTI-XA(UNFRACTIONATED): 0.28 [IU]/mL — AB (ref 0.30–0.70)
ANTI-XA(UNFRACTIONATED): 0.48 [IU]/mL (ref 0.30–0.70)
Anti-Xa(Unfractionated): <0.1 IU/mL — ABNORMAL LOW (ref 0.30–0.70)

## 2015-03-28 LAB — TROPONIN I: Troponin-I: 0.13 ng/mL — ABNORMAL HIGH

## 2015-04-29 DIAGNOSIS — E785 Hyperlipidemia, unspecified: Secondary | ICD-10-CM | POA: Diagnosis not present

## 2015-04-29 DIAGNOSIS — F4323 Adjustment disorder with mixed anxiety and depressed mood: Secondary | ICD-10-CM

## 2015-04-29 DIAGNOSIS — F0150 Vascular dementia without behavioral disturbance: Secondary | ICD-10-CM

## 2015-04-29 DIAGNOSIS — I2510 Atherosclerotic heart disease of native coronary artery without angina pectoris: Secondary | ICD-10-CM | POA: Diagnosis not present

## 2015-04-29 DIAGNOSIS — F508 Other eating disorders: Secondary | ICD-10-CM

## 2015-04-29 DIAGNOSIS — E039 Hypothyroidism, unspecified: Secondary | ICD-10-CM | POA: Diagnosis not present

## 2015-04-29 DIAGNOSIS — I10 Essential (primary) hypertension: Secondary | ICD-10-CM | POA: Diagnosis not present

## 2015-06-20 DIAGNOSIS — M3501 Sicca syndrome with keratoconjunctivitis: Secondary | ICD-10-CM | POA: Diagnosis not present

## 2015-06-27 DIAGNOSIS — I10 Essential (primary) hypertension: Secondary | ICD-10-CM | POA: Diagnosis not present

## 2015-06-27 DIAGNOSIS — E039 Hypothyroidism, unspecified: Secondary | ICD-10-CM | POA: Diagnosis not present

## 2015-07-01 DIAGNOSIS — F0150 Vascular dementia without behavioral disturbance: Secondary | ICD-10-CM

## 2015-07-01 DIAGNOSIS — F4323 Adjustment disorder with mixed anxiety and depressed mood: Secondary | ICD-10-CM

## 2015-07-01 DIAGNOSIS — E039 Hypothyroidism, unspecified: Secondary | ICD-10-CM | POA: Diagnosis not present

## 2015-07-01 DIAGNOSIS — S32000S Wedge compression fracture of unspecified lumbar vertebra, sequela: Secondary | ICD-10-CM

## 2015-07-01 DIAGNOSIS — I10 Essential (primary) hypertension: Secondary | ICD-10-CM | POA: Diagnosis not present

## 2015-07-01 DIAGNOSIS — I2510 Atherosclerotic heart disease of native coronary artery without angina pectoris: Secondary | ICD-10-CM | POA: Diagnosis not present

## 2015-07-01 DIAGNOSIS — E785 Hyperlipidemia, unspecified: Secondary | ICD-10-CM | POA: Diagnosis not present

## 2015-08-17 DIAGNOSIS — R062 Wheezing: Secondary | ICD-10-CM | POA: Diagnosis not present

## 2015-08-17 DIAGNOSIS — R05 Cough: Secondary | ICD-10-CM | POA: Diagnosis not present

## 2015-09-07 DIAGNOSIS — R630 Anorexia: Secondary | ICD-10-CM

## 2015-09-07 DIAGNOSIS — G9520 Unspecified cord compression: Secondary | ICD-10-CM

## 2015-09-07 DIAGNOSIS — F0150 Vascular dementia without behavioral disturbance: Secondary | ICD-10-CM

## 2015-09-07 DIAGNOSIS — E785 Hyperlipidemia, unspecified: Secondary | ICD-10-CM | POA: Diagnosis not present

## 2015-09-07 DIAGNOSIS — I10 Essential (primary) hypertension: Secondary | ICD-10-CM | POA: Diagnosis not present

## 2015-09-07 DIAGNOSIS — I2510 Atherosclerotic heart disease of native coronary artery without angina pectoris: Secondary | ICD-10-CM | POA: Diagnosis not present

## 2015-09-07 DIAGNOSIS — E039 Hypothyroidism, unspecified: Secondary | ICD-10-CM | POA: Diagnosis not present

## 2015-09-19 DIAGNOSIS — I25810 Atherosclerosis of coronary artery bypass graft(s) without angina pectoris: Secondary | ICD-10-CM | POA: Diagnosis not present

## 2015-10-26 DIAGNOSIS — F0150 Vascular dementia without behavioral disturbance: Secondary | ICD-10-CM

## 2015-10-26 DIAGNOSIS — F5089 Other specified eating disorder: Secondary | ICD-10-CM

## 2015-10-26 DIAGNOSIS — S32000S Wedge compression fracture of unspecified lumbar vertebra, sequela: Secondary | ICD-10-CM

## 2015-10-26 DIAGNOSIS — F4323 Adjustment disorder with mixed anxiety and depressed mood: Secondary | ICD-10-CM

## 2015-10-26 DIAGNOSIS — E039 Hypothyroidism, unspecified: Secondary | ICD-10-CM | POA: Diagnosis not present

## 2015-10-26 DIAGNOSIS — E785 Hyperlipidemia, unspecified: Secondary | ICD-10-CM | POA: Diagnosis not present

## 2015-10-26 DIAGNOSIS — I2510 Atherosclerotic heart disease of native coronary artery without angina pectoris: Secondary | ICD-10-CM | POA: Diagnosis not present

## 2015-10-26 DIAGNOSIS — I10 Essential (primary) hypertension: Secondary | ICD-10-CM | POA: Diagnosis not present

## 2015-12-23 DIAGNOSIS — F5089 Other specified eating disorder: Secondary | ICD-10-CM

## 2015-12-23 DIAGNOSIS — F323 Major depressive disorder, single episode, severe with psychotic features: Secondary | ICD-10-CM

## 2015-12-23 DIAGNOSIS — I2510 Atherosclerotic heart disease of native coronary artery without angina pectoris: Secondary | ICD-10-CM | POA: Diagnosis not present

## 2015-12-23 DIAGNOSIS — E039 Hypothyroidism, unspecified: Secondary | ICD-10-CM | POA: Diagnosis not present

## 2015-12-23 DIAGNOSIS — I10 Essential (primary) hypertension: Secondary | ICD-10-CM | POA: Diagnosis not present

## 2015-12-23 DIAGNOSIS — S32000S Wedge compression fracture of unspecified lumbar vertebra, sequela: Secondary | ICD-10-CM

## 2015-12-23 DIAGNOSIS — E785 Hyperlipidemia, unspecified: Secondary | ICD-10-CM | POA: Diagnosis not present

## 2016-01-28 IMAGING — CR DG CHEST 1V PORT
1 series · 1 of 1 positions shown · non-contrast
Comparison: 07/02/2012

CLINICAL DATA: Cough and fever.  Increasing shortness of breath.

EXAM:
PORTABLE CHEST - 1 VIEW

[ap]
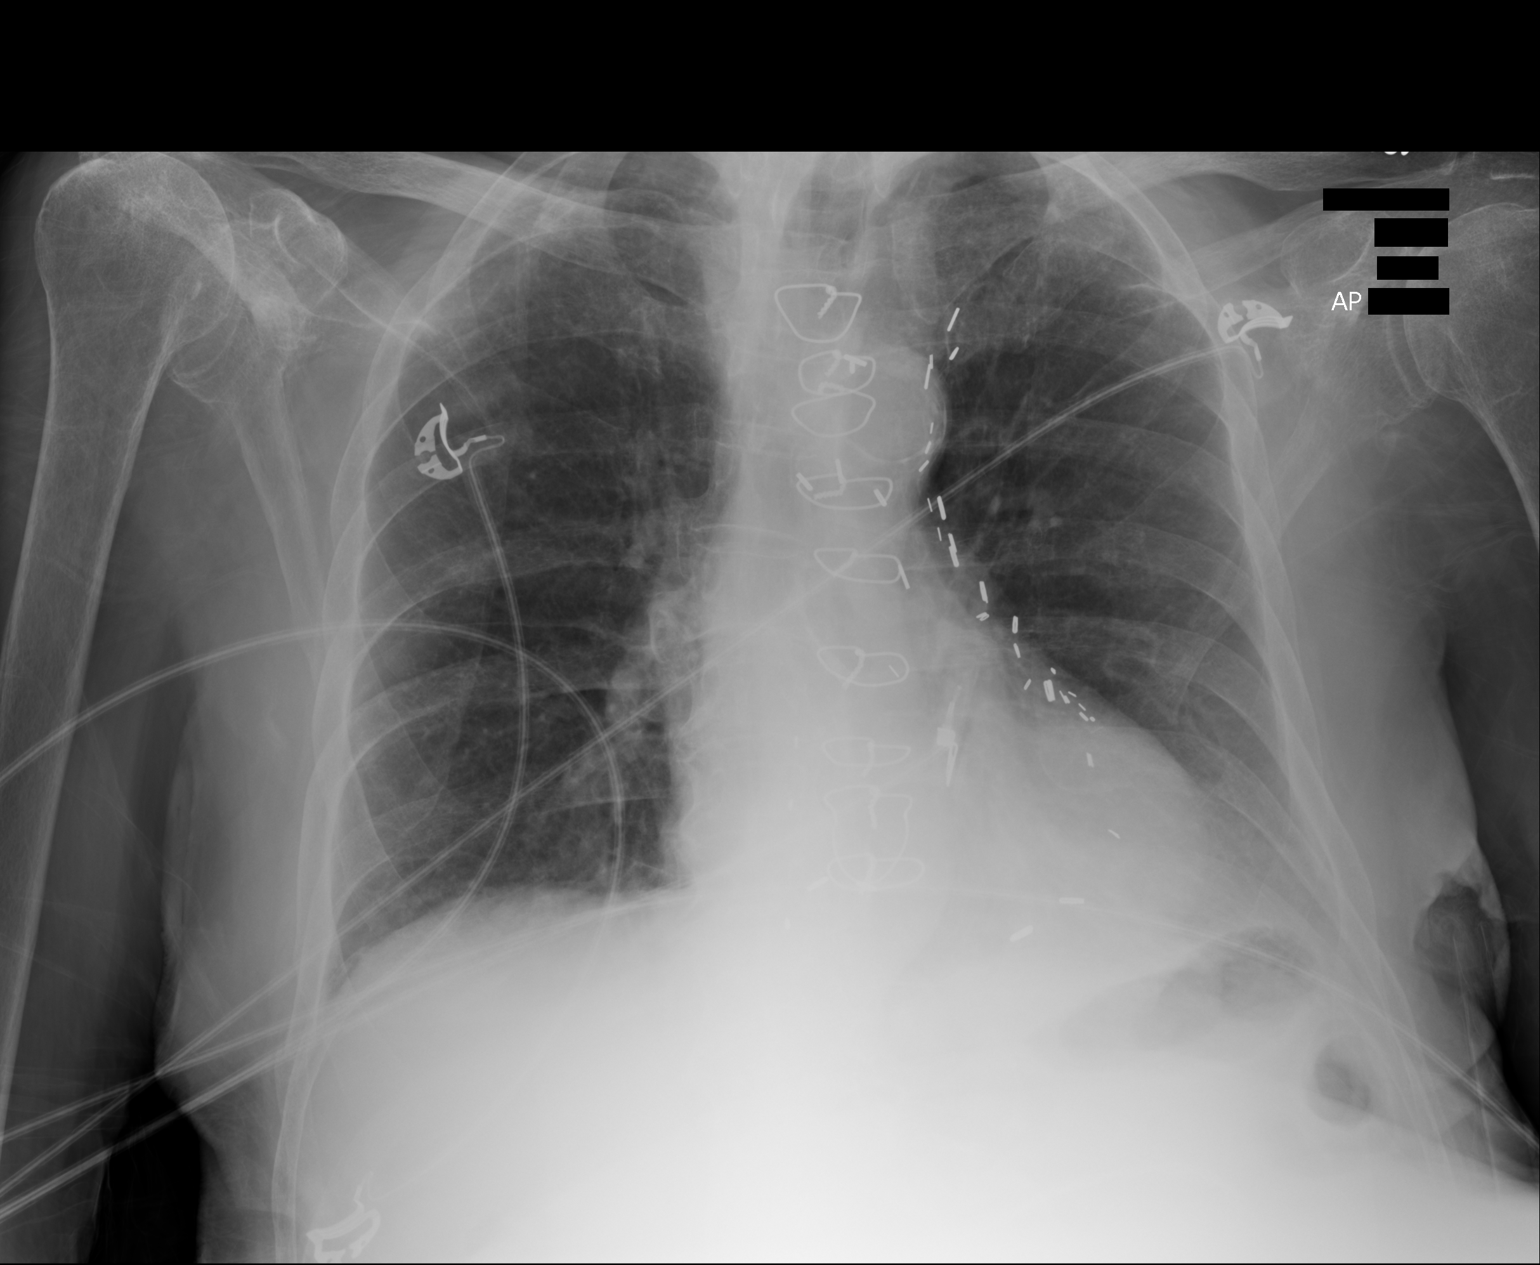

[1 of 1 positions shown; findings below may reference images not displayed]

FINDINGS: Heart size and pulmonary vascularity are normal. There is chronic
scarring at the left lung base. No acute infiltrates or effusions.

No acute osseous abnormality. Moderate arthritis of the right
shoulder.
IMPRESSION: No acute abnormalities. Chronic scarring at the left lung base
medially.

## 2016-03-15 DIAGNOSIS — I10 Essential (primary) hypertension: Secondary | ICD-10-CM | POA: Diagnosis not present

## 2016-03-15 DIAGNOSIS — E785 Hyperlipidemia, unspecified: Secondary | ICD-10-CM | POA: Diagnosis not present

## 2016-03-15 DIAGNOSIS — E039 Hypothyroidism, unspecified: Secondary | ICD-10-CM | POA: Diagnosis not present

## 2016-03-15 DIAGNOSIS — F5089 Other specified eating disorder: Secondary | ICD-10-CM

## 2016-03-15 DIAGNOSIS — I2510 Atherosclerotic heart disease of native coronary artery without angina pectoris: Secondary | ICD-10-CM | POA: Diagnosis not present

## 2016-03-15 DIAGNOSIS — F0150 Vascular dementia without behavioral disturbance: Secondary | ICD-10-CM

## 2016-03-15 DIAGNOSIS — F4321 Adjustment disorder with depressed mood: Secondary | ICD-10-CM

## 2016-04-27 DIAGNOSIS — F0150 Vascular dementia without behavioral disturbance: Secondary | ICD-10-CM

## 2016-04-27 DIAGNOSIS — E039 Hypothyroidism, unspecified: Secondary | ICD-10-CM | POA: Diagnosis not present

## 2016-04-27 DIAGNOSIS — I10 Essential (primary) hypertension: Secondary | ICD-10-CM | POA: Diagnosis not present

## 2016-04-27 DIAGNOSIS — E785 Hyperlipidemia, unspecified: Secondary | ICD-10-CM | POA: Diagnosis not present

## 2016-04-27 DIAGNOSIS — F4321 Adjustment disorder with depressed mood: Secondary | ICD-10-CM

## 2016-04-27 DIAGNOSIS — R630 Anorexia: Secondary | ICD-10-CM | POA: Diagnosis not present

## 2016-06-20 DIAGNOSIS — H2513 Age-related nuclear cataract, bilateral: Secondary | ICD-10-CM | POA: Diagnosis not present

## 2016-06-22 DIAGNOSIS — E785 Hyperlipidemia, unspecified: Secondary | ICD-10-CM | POA: Diagnosis not present

## 2016-06-22 DIAGNOSIS — F0150 Vascular dementia without behavioral disturbance: Secondary | ICD-10-CM | POA: Diagnosis not present

## 2016-06-22 DIAGNOSIS — R630 Anorexia: Secondary | ICD-10-CM

## 2016-06-22 DIAGNOSIS — E039 Hypothyroidism, unspecified: Secondary | ICD-10-CM | POA: Diagnosis not present

## 2016-06-22 DIAGNOSIS — I10 Essential (primary) hypertension: Secondary | ICD-10-CM | POA: Diagnosis not present

## 2016-06-22 DIAGNOSIS — F4320 Adjustment disorder, unspecified: Secondary | ICD-10-CM

## 2016-06-25 DIAGNOSIS — E039 Hypothyroidism, unspecified: Secondary | ICD-10-CM | POA: Diagnosis not present

## 2016-06-25 DIAGNOSIS — E785 Hyperlipidemia, unspecified: Secondary | ICD-10-CM | POA: Diagnosis not present

## 2016-06-25 DIAGNOSIS — I10 Essential (primary) hypertension: Secondary | ICD-10-CM | POA: Diagnosis not present

## 2016-08-22 DIAGNOSIS — F5089 Other specified eating disorder: Secondary | ICD-10-CM

## 2016-08-22 DIAGNOSIS — E785 Hyperlipidemia, unspecified: Secondary | ICD-10-CM

## 2016-08-22 DIAGNOSIS — F4323 Adjustment disorder with mixed anxiety and depressed mood: Secondary | ICD-10-CM

## 2016-08-22 DIAGNOSIS — I10 Essential (primary) hypertension: Secondary | ICD-10-CM

## 2016-08-22 DIAGNOSIS — F0150 Vascular dementia without behavioral disturbance: Secondary | ICD-10-CM

## 2016-08-22 DIAGNOSIS — E039 Hypothyroidism, unspecified: Secondary | ICD-10-CM

## 2016-09-06 DIAGNOSIS — H04129 Dry eye syndrome of unspecified lacrimal gland: Secondary | ICD-10-CM | POA: Diagnosis not present

## 2016-09-06 DIAGNOSIS — B309 Viral conjunctivitis, unspecified: Secondary | ICD-10-CM | POA: Diagnosis not present

## 2016-11-07 DIAGNOSIS — F4323 Adjustment disorder with mixed anxiety and depressed mood: Secondary | ICD-10-CM | POA: Diagnosis not present

## 2016-11-07 DIAGNOSIS — F0150 Vascular dementia without behavioral disturbance: Secondary | ICD-10-CM | POA: Diagnosis not present

## 2016-11-07 DIAGNOSIS — I10 Essential (primary) hypertension: Secondary | ICD-10-CM | POA: Diagnosis not present

## 2016-11-07 DIAGNOSIS — E785 Hyperlipidemia, unspecified: Secondary | ICD-10-CM | POA: Diagnosis not present

## 2016-11-07 DIAGNOSIS — E039 Hypothyroidism, unspecified: Secondary | ICD-10-CM | POA: Diagnosis not present

## 2016-11-07 DIAGNOSIS — R630 Anorexia: Secondary | ICD-10-CM | POA: Diagnosis not present

## 2016-12-28 DIAGNOSIS — R630 Anorexia: Secondary | ICD-10-CM | POA: Diagnosis not present

## 2016-12-28 DIAGNOSIS — E785 Hyperlipidemia, unspecified: Secondary | ICD-10-CM | POA: Diagnosis not present

## 2016-12-28 DIAGNOSIS — E039 Hypothyroidism, unspecified: Secondary | ICD-10-CM | POA: Diagnosis not present

## 2016-12-28 DIAGNOSIS — F4323 Adjustment disorder with mixed anxiety and depressed mood: Secondary | ICD-10-CM | POA: Diagnosis not present

## 2016-12-28 DIAGNOSIS — F0150 Vascular dementia without behavioral disturbance: Secondary | ICD-10-CM | POA: Diagnosis not present

## 2016-12-28 DIAGNOSIS — I10 Essential (primary) hypertension: Secondary | ICD-10-CM | POA: Diagnosis not present

## 2017-01-30 DIAGNOSIS — R4181 Age-related cognitive decline: Secondary | ICD-10-CM | POA: Diagnosis not present

## 2017-03-06 DIAGNOSIS — E039 Hypothyroidism, unspecified: Secondary | ICD-10-CM | POA: Diagnosis not present

## 2017-03-06 DIAGNOSIS — R630 Anorexia: Secondary | ICD-10-CM | POA: Diagnosis not present

## 2017-03-06 DIAGNOSIS — E785 Hyperlipidemia, unspecified: Secondary | ICD-10-CM | POA: Diagnosis not present

## 2017-03-06 DIAGNOSIS — F4323 Adjustment disorder with mixed anxiety and depressed mood: Secondary | ICD-10-CM | POA: Diagnosis not present

## 2017-03-06 DIAGNOSIS — F0150 Vascular dementia without behavioral disturbance: Secondary | ICD-10-CM | POA: Diagnosis not present

## 2017-03-06 DIAGNOSIS — I10 Essential (primary) hypertension: Secondary | ICD-10-CM | POA: Diagnosis not present

## 2017-05-10 DIAGNOSIS — F0150 Vascular dementia without behavioral disturbance: Secondary | ICD-10-CM | POA: Diagnosis not present

## 2017-05-10 DIAGNOSIS — I10 Essential (primary) hypertension: Secondary | ICD-10-CM | POA: Diagnosis not present

## 2017-05-10 DIAGNOSIS — A054 Foodborne Bacillus cereus intoxication: Secondary | ICD-10-CM | POA: Diagnosis not present

## 2017-05-10 DIAGNOSIS — R630 Anorexia: Secondary | ICD-10-CM | POA: Diagnosis not present

## 2017-05-10 DIAGNOSIS — E785 Hyperlipidemia, unspecified: Secondary | ICD-10-CM | POA: Diagnosis not present

## 2017-05-10 DIAGNOSIS — E039 Hypothyroidism, unspecified: Secondary | ICD-10-CM | POA: Diagnosis not present

## 2017-05-30 DIAGNOSIS — H353131 Nonexudative age-related macular degeneration, bilateral, early dry stage: Secondary | ICD-10-CM | POA: Diagnosis not present

## 2017-05-30 DIAGNOSIS — H04221 Epiphora due to insufficient drainage, right lacrimal gland: Secondary | ICD-10-CM | POA: Diagnosis not present

## 2017-06-24 DIAGNOSIS — E785 Hyperlipidemia, unspecified: Secondary | ICD-10-CM | POA: Diagnosis not present

## 2017-06-24 DIAGNOSIS — E039 Hypothyroidism, unspecified: Secondary | ICD-10-CM | POA: Diagnosis not present

## 2017-06-24 DIAGNOSIS — I10 Essential (primary) hypertension: Secondary | ICD-10-CM | POA: Diagnosis not present

## 2017-06-26 DIAGNOSIS — E785 Hyperlipidemia, unspecified: Secondary | ICD-10-CM | POA: Diagnosis not present

## 2017-06-26 DIAGNOSIS — F0150 Vascular dementia without behavioral disturbance: Secondary | ICD-10-CM | POA: Diagnosis not present

## 2017-06-26 DIAGNOSIS — R630 Anorexia: Secondary | ICD-10-CM | POA: Diagnosis not present

## 2017-06-26 DIAGNOSIS — I10 Essential (primary) hypertension: Secondary | ICD-10-CM | POA: Diagnosis not present

## 2017-06-26 DIAGNOSIS — E039 Hypothyroidism, unspecified: Secondary | ICD-10-CM | POA: Diagnosis not present

## 2017-06-26 DIAGNOSIS — F39 Unspecified mood [affective] disorder: Secondary | ICD-10-CM | POA: Diagnosis not present

## 2017-08-14 ENCOUNTER — Emergency Department: Payer: PRIVATE HEALTH INSURANCE

## 2017-08-14 ENCOUNTER — Emergency Department
Admission: EM | Admit: 2017-08-14 | Discharge: 2017-08-15 | Disposition: A | Discharge status: Home / Self Care | Payer: PRIVATE HEALTH INSURANCE | Attending: Emergency Medicine | Admitting: Emergency Medicine

## 2017-08-14 DIAGNOSIS — S82132A Displaced fracture of medial condyle of left tibia, initial encounter for closed fracture: Secondary | ICD-10-CM | POA: Insufficient documentation

## 2017-08-14 DIAGNOSIS — W010XXA Fall on same level from slipping, tripping and stumbling without subsequent striking against object, initial encounter: Secondary | ICD-10-CM | POA: Diagnosis not present

## 2017-08-14 DIAGNOSIS — I10 Essential (primary) hypertension: Secondary | ICD-10-CM | POA: Diagnosis not present

## 2017-08-14 DIAGNOSIS — S82142A Displaced bicondylar fracture of left tibia, initial encounter for closed fracture: Secondary | ICD-10-CM | POA: Diagnosis not present

## 2017-08-14 DIAGNOSIS — Y9389 Activity, other specified: Secondary | ICD-10-CM | POA: Insufficient documentation

## 2017-08-14 DIAGNOSIS — S8992XA Unspecified injury of left lower leg, initial encounter: Secondary | ICD-10-CM | POA: Diagnosis present

## 2017-08-14 DIAGNOSIS — M79605 Pain in left leg: Secondary | ICD-10-CM | POA: Diagnosis not present

## 2017-08-14 DIAGNOSIS — M79662 Pain in left lower leg: Secondary | ICD-10-CM | POA: Diagnosis not present

## 2017-08-14 DIAGNOSIS — Y929 Unspecified place or not applicable: Secondary | ICD-10-CM | POA: Insufficient documentation

## 2017-08-14 DIAGNOSIS — Y999 Unspecified external cause status: Secondary | ICD-10-CM | POA: Insufficient documentation

## 2017-08-14 DIAGNOSIS — W19XXXA Unspecified fall, initial encounter: Secondary | ICD-10-CM | POA: Diagnosis not present

## 2017-08-14 NOTE — ED Triage Notes (Signed)
Pt [facility] ED via [facility] at [facility]. Pt had facility X-ray post fall and is sent to ED due to displaced fracture of the left tibia. Pt is A&O x4. I pack applied to area.

## 2017-08-15 ENCOUNTER — Telehealth: Payer: Self-pay

## 2017-08-15 DIAGNOSIS — S82142A Displaced bicondylar fracture of left tibia, initial encounter for closed fracture: Secondary | ICD-10-CM | POA: Diagnosis not present

## 2017-08-15 DIAGNOSIS — S22080D Wedge compression fracture of T11-T12 vertebra, subsequent encounter for fracture with routine healing: Secondary | ICD-10-CM | POA: Diagnosis not present

## 2017-08-15 DIAGNOSIS — Z743 Need for continuous supervision: Secondary | ICD-10-CM | POA: Diagnosis not present

## 2017-08-15 DIAGNOSIS — M79605 Pain in left leg: Secondary | ICD-10-CM | POA: Diagnosis not present

## 2017-08-15 DIAGNOSIS — Z9181 History of falling: Secondary | ICD-10-CM | POA: Diagnosis not present

## 2017-08-15 MED ORDER — OXYCODONE-ACETAMINOPHEN 5-325 MG PO TABS
1.0000 | ORAL_TABLET | Freq: Once | ORAL | Status: AC
  Administered 2017-08-15: 1 via ORAL
  Filled 2017-08-15: qty 1

## 2017-08-15 MED ORDER — OXYCODONE-ACETAMINOPHEN 5-325 MG PO TABS: 1.0000 | ORAL_TABLET | Freq: Four times a day (QID) | ORAL | 0 refills | Status: AC | PRN

## 2017-08-15 NOTE — Telephone Encounter (Signed)
Has fracture in leg. Not sure of disposition--will await what happens and follow up as appropriate

## 2017-08-15 NOTE — ED Notes (Signed)
X-ray in with pt 

## 2017-08-15 NOTE — ED Provider Notes (Signed)
[facility] Emergency Department Provider Note    First MD Initiated Contact with Patient 08/14/17 2328     (approximate)  I have reviewed the triage vital signs and the nursing notes.   HISTORY  Chief Complaint Leg Injury    HPI Charlynn GrimesRoland G Couchman is a 83 y.o. male is [facility] with history of accidental fall with resultant left leg pain "just below the knee".  Patient admits to 8 out of 10 pain with movement however no pain at present with staying still.  Patient states fall occurred at 6:30 PM "I got tangled in my walker".  EMS states that patient had an x-ray performed at a facility which revealed a displaced fracture of the left tibia which resulted in the patient being transferred to the emergency department.  Patient denies any head injury during the fall.  Past medical history Hypertension Hyperlipidemia Dementia There are no active problems to display for this patient.    Prior to Admission medications   Not on File    Allergies No known drug allergies  No family history on file.  Social History Social History   Tobacco Use  . Smoking status: Not on file  Substance Use Topics  . Alcohol use: Not on file  . Drug use: Not on file    Review of Systems Constitutional: No fever/chills Eyes: No visual changes. ENT: No sore throat. Cardiovascular: Denies chest pain. Respiratory: Denies shortness of breath. Gastrointestinal: No abdominal pain.  No nausea, no vomiting.  No diarrhea.  No constipation. Genitourinary: Negative for dysuria. Musculoskeletal: Negative for neck pain.  Negative for back pain.  Positive for leg pain Integumentary: Negative for rash. Neurological: Negative for headaches, focal weakness or numbness.   ____________________________________________   PHYSICAL EXAM:  VITAL SIGNS: ED Triage Vitals  Enc Vitals Group     BP 08/14/17 2322 (!) 121/56     Pulse Rate 08/14/17 2322 71     Resp  08/14/17 2322 16     Temp 08/14/17 2322 97.8 F (36.6 C)     Temp Source 08/14/17 2322 Oral     SpO2 08/14/17 2322 97 %     Weight 08/14/17 2323 56.7 kg (125 lb)     Height 08/14/17 2323 1.702 m (5\' 7" )     Head Circumference --      Peak Flow --      Pain Score --      Pain Loc --      Pain Edu? --      Excl. in GC? --     Constitutional: Alert and oriented. Well appearing and in no acute distress. Eyes: Conjunctivae are normal.  Head: Atraumatic. Mouth/Throat: Mucous membranes are moist.  Oropharynx non-erythematous. Neck: No stridor.  Cardiovascular: Normal rate, regular rhythm. Good peripheral circulation. Grossly normal heart sounds. Respiratory: Normal respiratory effort.  No retractions. Lungs CTAB. Gastrointestinal: Soft and nontender. No distention.  Musculoskeletal: Pain to palpation left tibial plateau swelling noted.  Neurologic:  Normal speech and language. No gross focal neurologic deficits are appreciated.  Skin:  Skin is warm, dry and intact. No rash noted. Psychiatric: Mood and affect are normal. Speech and behavior are normal.   RADIOLOGY I, [place] N BROWN, personally viewed and evaluated these images (plain radiographs) as part of my medical decision making, as well as reviewing the written report by the radiologist.  Dg Tibia/fibula Left  Result Date: 08/15/2017 CLINICAL DATA:  Status post fall, with left  lower leg pain and swelling. Initial encounter. EXAM: LEFT TIBIA AND FIBULA - 2 VIEW COMPARISON:  None. FINDINGS: There is a mildly displaced fracture through the medial tibial plateau, extending into the proximal diaphysis. No definite additional fractures are seen. The knee joint is incompletely assessed. The ankle mortise is not fully assess, but appears grossly unremarkable. The subtalar joint is unremarkable. Diffuse vascular calcifications are seen. IMPRESSION: 1. Mildly displaced fracture through the medial tibial plateau, extending into the proximal  diaphysis. This would be better assessed on dedicated knee radiographs. 2. Diffuse vascular calcifications seen. Electronically Signed   By: Roanna RaiderJeffery  Chang M.D.   On: 08/15/2017 00:19     Procedures   ____________________________________________   INITIAL IMPRESSION / ASSESSMENT AND PLAN / ED COURSE  As part of my medical decision making, I reviewed the following data within the electronic MEDICAL RECORD [id number] year old male presenting with above-stated history of accidental fall with resultant left lower leg pain.  X-ray consistent with a mildly displaced left medial tibial plateau fracture extending to the proximal diaphysis.  Knee immobilizer placed on the patient.  Percocet 12/21/2023 given.  [facility] will be [facility] with recommendation to follow-up with Dr. Martha ClanKrasinski orthopedic surgery today. ____________________________________________  FINAL CLINICAL IMPRESSION(S) / ED DIAGNOSES  Final diagnoses:  Closed fracture of medial portion of left tibial plateau, initial encounter     MEDICATIONS GIVEN DURING THIS VISIT:  Medications - No data to display   ED Discharge Orders    None       Note:  This document was prepared using Dragon voice recognition software and may include unintentional dictation errors.    Darci CurrentBrown, [place] N, MD 08/15/17 0200

## 2017-08-15 NOTE — Telephone Encounter (Signed)
PLEASE NOTE: All timestamps contained within this report are represented as [place] Standard Time. CONFIDENTIALTY NOTICE: This fax transmission is intended only for the addressee. It contains information that is legally privileged, confidential or otherwise protected from use or disclosure. If you are not the intended recipient, you are strictly prohibited from reviewing, disclosing, copying using or disseminating any of this information or taking any action in reliance on or regarding this information. If you have received this fax in error, please notify us immediately by telephone so that we can arrange for its return to us. Phone: [phone], Toll-Free: [phone], Fax: [phone] Page: 1 of 1 Call Id: [id number] [facility] - Client Nonclinical Telephone Record [facility] Client [facility] Contact Type Call Who Is Calling Physician / Provider / Hospital Call Type Provider Call Message Only Reason for Call Request to send message to Office Initial Comment Caller from [facility] wants to let Office know pt was send to the ER. Pt James Griffin DOB 01-25-1922 Additional Comment Call Closed By: Lynett FishMarisabel Flores Transaction Date/Time: 08/14/2017 10:38:21 PM (ET)

## 2017-08-15 NOTE — ED Notes (Signed)
Knee immobilizer applied to the left knee Pt tolerated well.

## 2017-08-15 NOTE — Telephone Encounter (Signed)
Per chart review tab pt seen [facility] ED on 08/14/17.

## 2017-08-16 DIAGNOSIS — Y92120 Kitchen in nursing home as the place of occurrence of the external cause: Secondary | ICD-10-CM | POA: Diagnosis not present

## 2017-08-16 DIAGNOSIS — S82102A Unspecified fracture of upper end of left tibia, initial encounter for closed fracture: Secondary | ICD-10-CM | POA: Diagnosis not present

## 2017-08-30 DIAGNOSIS — S82102A Unspecified fracture of upper end of left tibia, initial encounter for closed fracture: Secondary | ICD-10-CM | POA: Diagnosis not present

## 2017-08-30 DIAGNOSIS — R638 Other symptoms and signs concerning food and fluid intake: Secondary | ICD-10-CM | POA: Diagnosis not present

## 2017-08-30 DIAGNOSIS — R197 Diarrhea, unspecified: Secondary | ICD-10-CM | POA: Diagnosis not present

## 2017-08-30 DIAGNOSIS — E785 Hyperlipidemia, unspecified: Secondary | ICD-10-CM | POA: Diagnosis not present

## 2017-08-30 DIAGNOSIS — F39 Unspecified mood [affective] disorder: Secondary | ICD-10-CM | POA: Diagnosis not present

## 2017-08-30 DIAGNOSIS — E039 Hypothyroidism, unspecified: Secondary | ICD-10-CM | POA: Diagnosis not present

## 2017-08-30 DIAGNOSIS — I10 Essential (primary) hypertension: Secondary | ICD-10-CM | POA: Diagnosis not present

## 2017-08-30 DIAGNOSIS — F0150 Vascular dementia without behavioral disturbance: Secondary | ICD-10-CM | POA: Diagnosis not present

## 2017-09-02 DIAGNOSIS — E039 Hypothyroidism, unspecified: Secondary | ICD-10-CM | POA: Diagnosis not present

## 2017-09-03 DIAGNOSIS — R197 Diarrhea, unspecified: Secondary | ICD-10-CM | POA: Diagnosis not present

## 2017-09-05 DIAGNOSIS — S82102A Unspecified fracture of upper end of left tibia, initial encounter for closed fracture: Secondary | ICD-10-CM | POA: Diagnosis not present

## 2017-09-18 DIAGNOSIS — E785 Hyperlipidemia, unspecified: Secondary | ICD-10-CM | POA: Diagnosis not present

## 2017-09-18 DIAGNOSIS — I259 Chronic ischemic heart disease, unspecified: Secondary | ICD-10-CM | POA: Diagnosis not present

## 2017-09-18 DIAGNOSIS — I10 Essential (primary) hypertension: Secondary | ICD-10-CM | POA: Diagnosis not present

## 2017-09-18 DIAGNOSIS — F0390 Unspecified dementia without behavioral disturbance: Secondary | ICD-10-CM | POA: Diagnosis not present

## 2017-09-18 DIAGNOSIS — W010XXD Fall on same level from slipping, tripping and stumbling without subsequent striking against object, subsequent encounter: Secondary | ICD-10-CM | POA: Diagnosis not present

## 2017-09-18 DIAGNOSIS — E039 Hypothyroidism, unspecified: Secondary | ICD-10-CM | POA: Diagnosis not present

## 2017-09-18 DIAGNOSIS — S82142D Displaced bicondylar fracture of left tibia, subsequent encounter for closed fracture with routine healing: Secondary | ICD-10-CM | POA: Diagnosis not present

## 2017-09-24 DIAGNOSIS — E039 Hypothyroidism, unspecified: Secondary | ICD-10-CM | POA: Diagnosis not present

## 2017-09-24 DIAGNOSIS — I10 Essential (primary) hypertension: Secondary | ICD-10-CM | POA: Diagnosis not present

## 2017-09-24 DIAGNOSIS — I259 Chronic ischemic heart disease, unspecified: Secondary | ICD-10-CM | POA: Diagnosis not present

## 2017-09-24 DIAGNOSIS — E785 Hyperlipidemia, unspecified: Secondary | ICD-10-CM | POA: Diagnosis not present

## 2017-09-24 DIAGNOSIS — S82142D Displaced bicondylar fracture of left tibia, subsequent encounter for closed fracture with routine healing: Secondary | ICD-10-CM | POA: Diagnosis not present

## 2017-09-24 DIAGNOSIS — W010XXD Fall on same level from slipping, tripping and stumbling without subsequent striking against object, subsequent encounter: Secondary | ICD-10-CM | POA: Diagnosis not present

## 2017-09-24 DIAGNOSIS — F0390 Unspecified dementia without behavioral disturbance: Secondary | ICD-10-CM | POA: Diagnosis not present

## 2017-09-26 DIAGNOSIS — I259 Chronic ischemic heart disease, unspecified: Secondary | ICD-10-CM | POA: Diagnosis not present

## 2017-09-26 DIAGNOSIS — W010XXD Fall on same level from slipping, tripping and stumbling without subsequent striking against object, subsequent encounter: Secondary | ICD-10-CM | POA: Diagnosis not present

## 2017-09-26 DIAGNOSIS — I10 Essential (primary) hypertension: Secondary | ICD-10-CM | POA: Diagnosis not present

## 2017-09-26 DIAGNOSIS — E039 Hypothyroidism, unspecified: Secondary | ICD-10-CM | POA: Diagnosis not present

## 2017-09-26 DIAGNOSIS — F0390 Unspecified dementia without behavioral disturbance: Secondary | ICD-10-CM | POA: Diagnosis not present

## 2017-09-26 DIAGNOSIS — S82142D Displaced bicondylar fracture of left tibia, subsequent encounter for closed fracture with routine healing: Secondary | ICD-10-CM | POA: Diagnosis not present

## 2017-09-26 DIAGNOSIS — E785 Hyperlipidemia, unspecified: Secondary | ICD-10-CM | POA: Diagnosis not present

## 2017-10-02 DIAGNOSIS — E039 Hypothyroidism, unspecified: Secondary | ICD-10-CM | POA: Diagnosis not present

## 2017-10-02 DIAGNOSIS — S82142D Displaced bicondylar fracture of left tibia, subsequent encounter for closed fracture with routine healing: Secondary | ICD-10-CM | POA: Diagnosis not present

## 2017-10-02 DIAGNOSIS — I10 Essential (primary) hypertension: Secondary | ICD-10-CM | POA: Diagnosis not present

## 2017-10-02 DIAGNOSIS — W010XXD Fall on same level from slipping, tripping and stumbling without subsequent striking against object, subsequent encounter: Secondary | ICD-10-CM | POA: Diagnosis not present

## 2017-10-02 DIAGNOSIS — I259 Chronic ischemic heart disease, unspecified: Secondary | ICD-10-CM | POA: Diagnosis not present

## 2017-10-02 DIAGNOSIS — E785 Hyperlipidemia, unspecified: Secondary | ICD-10-CM | POA: Diagnosis not present

## 2017-10-02 DIAGNOSIS — F0390 Unspecified dementia without behavioral disturbance: Secondary | ICD-10-CM | POA: Diagnosis not present

## 2017-10-03 DIAGNOSIS — S82102D Unspecified fracture of upper end of left tibia, subsequent encounter for closed fracture with routine healing: Secondary | ICD-10-CM | POA: Diagnosis not present

## 2017-10-03 DIAGNOSIS — E039 Hypothyroidism, unspecified: Secondary | ICD-10-CM | POA: Diagnosis not present

## 2017-10-04 DIAGNOSIS — E039 Hypothyroidism, unspecified: Secondary | ICD-10-CM | POA: Diagnosis not present

## 2017-10-04 DIAGNOSIS — W010XXD Fall on same level from slipping, tripping and stumbling without subsequent striking against object, subsequent encounter: Secondary | ICD-10-CM | POA: Diagnosis not present

## 2017-10-04 DIAGNOSIS — I259 Chronic ischemic heart disease, unspecified: Secondary | ICD-10-CM | POA: Diagnosis not present

## 2017-10-04 DIAGNOSIS — E785 Hyperlipidemia, unspecified: Secondary | ICD-10-CM | POA: Diagnosis not present

## 2017-10-04 DIAGNOSIS — F0390 Unspecified dementia without behavioral disturbance: Secondary | ICD-10-CM | POA: Diagnosis not present

## 2017-10-04 DIAGNOSIS — S82142D Displaced bicondylar fracture of left tibia, subsequent encounter for closed fracture with routine healing: Secondary | ICD-10-CM | POA: Diagnosis not present

## 2017-10-04 DIAGNOSIS — I10 Essential (primary) hypertension: Secondary | ICD-10-CM | POA: Diagnosis not present

## 2017-10-07 DIAGNOSIS — E785 Hyperlipidemia, unspecified: Secondary | ICD-10-CM | POA: Diagnosis not present

## 2017-10-07 DIAGNOSIS — S82142D Displaced bicondylar fracture of left tibia, subsequent encounter for closed fracture with routine healing: Secondary | ICD-10-CM | POA: Diagnosis not present

## 2017-10-07 DIAGNOSIS — W010XXD Fall on same level from slipping, tripping and stumbling without subsequent striking against object, subsequent encounter: Secondary | ICD-10-CM | POA: Diagnosis not present

## 2017-10-07 DIAGNOSIS — E039 Hypothyroidism, unspecified: Secondary | ICD-10-CM | POA: Diagnosis not present

## 2017-10-07 DIAGNOSIS — I10 Essential (primary) hypertension: Secondary | ICD-10-CM | POA: Diagnosis not present

## 2017-10-07 DIAGNOSIS — F0390 Unspecified dementia without behavioral disturbance: Secondary | ICD-10-CM | POA: Diagnosis not present

## 2017-10-07 DIAGNOSIS — I259 Chronic ischemic heart disease, unspecified: Secondary | ICD-10-CM | POA: Diagnosis not present

## 2017-10-09 DIAGNOSIS — I10 Essential (primary) hypertension: Secondary | ICD-10-CM | POA: Diagnosis not present

## 2017-10-09 DIAGNOSIS — E785 Hyperlipidemia, unspecified: Secondary | ICD-10-CM | POA: Diagnosis not present

## 2017-10-09 DIAGNOSIS — I259 Chronic ischemic heart disease, unspecified: Secondary | ICD-10-CM | POA: Diagnosis not present

## 2017-10-09 DIAGNOSIS — S82142D Displaced bicondylar fracture of left tibia, subsequent encounter for closed fracture with routine healing: Secondary | ICD-10-CM | POA: Diagnosis not present

## 2017-10-09 DIAGNOSIS — F0390 Unspecified dementia without behavioral disturbance: Secondary | ICD-10-CM | POA: Diagnosis not present

## 2017-10-09 DIAGNOSIS — W010XXD Fall on same level from slipping, tripping and stumbling without subsequent striking against object, subsequent encounter: Secondary | ICD-10-CM | POA: Diagnosis not present

## 2017-10-09 DIAGNOSIS — E039 Hypothyroidism, unspecified: Secondary | ICD-10-CM | POA: Diagnosis not present

## 2017-10-15 DIAGNOSIS — I259 Chronic ischemic heart disease, unspecified: Secondary | ICD-10-CM | POA: Diagnosis not present

## 2017-10-15 DIAGNOSIS — W010XXD Fall on same level from slipping, tripping and stumbling without subsequent striking against object, subsequent encounter: Secondary | ICD-10-CM | POA: Diagnosis not present

## 2017-10-15 DIAGNOSIS — F0390 Unspecified dementia without behavioral disturbance: Secondary | ICD-10-CM | POA: Diagnosis not present

## 2017-10-15 DIAGNOSIS — I10 Essential (primary) hypertension: Secondary | ICD-10-CM | POA: Diagnosis not present

## 2017-10-15 DIAGNOSIS — S82142D Displaced bicondylar fracture of left tibia, subsequent encounter for closed fracture with routine healing: Secondary | ICD-10-CM | POA: Diagnosis not present

## 2017-10-15 DIAGNOSIS — E785 Hyperlipidemia, unspecified: Secondary | ICD-10-CM | POA: Diagnosis not present

## 2017-10-15 DIAGNOSIS — E039 Hypothyroidism, unspecified: Secondary | ICD-10-CM | POA: Diagnosis not present

## 2017-10-18 DIAGNOSIS — I10 Essential (primary) hypertension: Secondary | ICD-10-CM | POA: Diagnosis not present

## 2017-10-18 DIAGNOSIS — F0390 Unspecified dementia without behavioral disturbance: Secondary | ICD-10-CM | POA: Diagnosis not present

## 2017-10-18 DIAGNOSIS — E039 Hypothyroidism, unspecified: Secondary | ICD-10-CM | POA: Diagnosis not present

## 2017-10-18 DIAGNOSIS — I259 Chronic ischemic heart disease, unspecified: Secondary | ICD-10-CM | POA: Diagnosis not present

## 2017-10-18 DIAGNOSIS — S82142D Displaced bicondylar fracture of left tibia, subsequent encounter for closed fracture with routine healing: Secondary | ICD-10-CM | POA: Diagnosis not present

## 2017-10-18 DIAGNOSIS — E785 Hyperlipidemia, unspecified: Secondary | ICD-10-CM | POA: Diagnosis not present

## 2017-10-18 DIAGNOSIS — W010XXD Fall on same level from slipping, tripping and stumbling without subsequent striking against object, subsequent encounter: Secondary | ICD-10-CM | POA: Diagnosis not present

## 2017-10-21 DIAGNOSIS — W010XXD Fall on same level from slipping, tripping and stumbling without subsequent striking against object, subsequent encounter: Secondary | ICD-10-CM | POA: Diagnosis not present

## 2017-10-21 DIAGNOSIS — E785 Hyperlipidemia, unspecified: Secondary | ICD-10-CM | POA: Diagnosis not present

## 2017-10-21 DIAGNOSIS — E039 Hypothyroidism, unspecified: Secondary | ICD-10-CM | POA: Diagnosis not present

## 2017-10-21 DIAGNOSIS — I259 Chronic ischemic heart disease, unspecified: Secondary | ICD-10-CM | POA: Diagnosis not present

## 2017-10-21 DIAGNOSIS — I10 Essential (primary) hypertension: Secondary | ICD-10-CM | POA: Diagnosis not present

## 2017-10-21 DIAGNOSIS — F0390 Unspecified dementia without behavioral disturbance: Secondary | ICD-10-CM | POA: Diagnosis not present

## 2017-10-21 DIAGNOSIS — S82142D Displaced bicondylar fracture of left tibia, subsequent encounter for closed fracture with routine healing: Secondary | ICD-10-CM | POA: Diagnosis not present

## 2017-10-24 DIAGNOSIS — I10 Essential (primary) hypertension: Secondary | ICD-10-CM | POA: Diagnosis not present

## 2017-10-24 DIAGNOSIS — E785 Hyperlipidemia, unspecified: Secondary | ICD-10-CM | POA: Diagnosis not present

## 2017-10-24 DIAGNOSIS — I259 Chronic ischemic heart disease, unspecified: Secondary | ICD-10-CM | POA: Diagnosis not present

## 2017-10-24 DIAGNOSIS — F0390 Unspecified dementia without behavioral disturbance: Secondary | ICD-10-CM | POA: Diagnosis not present

## 2017-10-24 DIAGNOSIS — E039 Hypothyroidism, unspecified: Secondary | ICD-10-CM | POA: Diagnosis not present

## 2017-10-24 DIAGNOSIS — S82142D Displaced bicondylar fracture of left tibia, subsequent encounter for closed fracture with routine healing: Secondary | ICD-10-CM | POA: Diagnosis not present

## 2017-10-24 DIAGNOSIS — W010XXD Fall on same level from slipping, tripping and stumbling without subsequent striking against object, subsequent encounter: Secondary | ICD-10-CM | POA: Diagnosis not present

## 2017-10-28 DIAGNOSIS — I10 Essential (primary) hypertension: Secondary | ICD-10-CM | POA: Diagnosis not present

## 2017-10-28 DIAGNOSIS — E039 Hypothyroidism, unspecified: Secondary | ICD-10-CM | POA: Diagnosis not present

## 2017-10-28 DIAGNOSIS — F0390 Unspecified dementia without behavioral disturbance: Secondary | ICD-10-CM | POA: Diagnosis not present

## 2017-10-28 DIAGNOSIS — S82142D Displaced bicondylar fracture of left tibia, subsequent encounter for closed fracture with routine healing: Secondary | ICD-10-CM | POA: Diagnosis not present

## 2017-10-28 DIAGNOSIS — E785 Hyperlipidemia, unspecified: Secondary | ICD-10-CM | POA: Diagnosis not present

## 2017-10-28 DIAGNOSIS — I259 Chronic ischemic heart disease, unspecified: Secondary | ICD-10-CM | POA: Diagnosis not present

## 2017-10-28 DIAGNOSIS — W010XXD Fall on same level from slipping, tripping and stumbling without subsequent striking against object, subsequent encounter: Secondary | ICD-10-CM | POA: Diagnosis not present

## 2017-10-30 DIAGNOSIS — E785 Hyperlipidemia, unspecified: Secondary | ICD-10-CM | POA: Diagnosis not present

## 2017-10-30 DIAGNOSIS — I10 Essential (primary) hypertension: Secondary | ICD-10-CM | POA: Diagnosis not present

## 2017-10-30 DIAGNOSIS — F0150 Vascular dementia without behavioral disturbance: Secondary | ICD-10-CM | POA: Diagnosis not present

## 2017-10-30 DIAGNOSIS — F39 Unspecified mood [affective] disorder: Secondary | ICD-10-CM | POA: Diagnosis not present

## 2017-10-30 DIAGNOSIS — E039 Hypothyroidism, unspecified: Secondary | ICD-10-CM | POA: Diagnosis not present

## 2017-10-30 DIAGNOSIS — R638 Other symptoms and signs concerning food and fluid intake: Secondary | ICD-10-CM | POA: Diagnosis not present

## 2017-10-31 DIAGNOSIS — I10 Essential (primary) hypertension: Secondary | ICD-10-CM | POA: Diagnosis not present

## 2017-10-31 DIAGNOSIS — I259 Chronic ischemic heart disease, unspecified: Secondary | ICD-10-CM | POA: Diagnosis not present

## 2017-10-31 DIAGNOSIS — W010XXD Fall on same level from slipping, tripping and stumbling without subsequent striking against object, subsequent encounter: Secondary | ICD-10-CM | POA: Diagnosis not present

## 2017-10-31 DIAGNOSIS — S82142D Displaced bicondylar fracture of left tibia, subsequent encounter for closed fracture with routine healing: Secondary | ICD-10-CM | POA: Diagnosis not present

## 2017-10-31 DIAGNOSIS — F0390 Unspecified dementia without behavioral disturbance: Secondary | ICD-10-CM | POA: Diagnosis not present

## 2017-10-31 DIAGNOSIS — E039 Hypothyroidism, unspecified: Secondary | ICD-10-CM | POA: Diagnosis not present

## 2017-10-31 DIAGNOSIS — E785 Hyperlipidemia, unspecified: Secondary | ICD-10-CM | POA: Diagnosis not present

## 2017-11-04 DIAGNOSIS — E785 Hyperlipidemia, unspecified: Secondary | ICD-10-CM | POA: Diagnosis not present

## 2017-11-04 DIAGNOSIS — W010XXD Fall on same level from slipping, tripping and stumbling without subsequent striking against object, subsequent encounter: Secondary | ICD-10-CM | POA: Diagnosis not present

## 2017-11-04 DIAGNOSIS — I259 Chronic ischemic heart disease, unspecified: Secondary | ICD-10-CM | POA: Diagnosis not present

## 2017-11-04 DIAGNOSIS — E039 Hypothyroidism, unspecified: Secondary | ICD-10-CM | POA: Diagnosis not present

## 2017-11-04 DIAGNOSIS — I10 Essential (primary) hypertension: Secondary | ICD-10-CM | POA: Diagnosis not present

## 2017-11-04 DIAGNOSIS — F0390 Unspecified dementia without behavioral disturbance: Secondary | ICD-10-CM | POA: Diagnosis not present

## 2017-11-04 DIAGNOSIS — S82142D Displaced bicondylar fracture of left tibia, subsequent encounter for closed fracture with routine healing: Secondary | ICD-10-CM | POA: Diagnosis not present

## 2017-11-07 ENCOUNTER — Telehealth: Payer: Self-pay | Admitting: Internal Medicine

## 2017-11-07 DIAGNOSIS — E039 Hypothyroidism, unspecified: Secondary | ICD-10-CM | POA: Diagnosis not present

## 2017-11-07 DIAGNOSIS — S82142D Displaced bicondylar fracture of left tibia, subsequent encounter for closed fracture with routine healing: Secondary | ICD-10-CM | POA: Diagnosis not present

## 2017-11-07 DIAGNOSIS — I259 Chronic ischemic heart disease, unspecified: Secondary | ICD-10-CM | POA: Diagnosis not present

## 2017-11-07 DIAGNOSIS — W010XXD Fall on same level from slipping, tripping and stumbling without subsequent striking against object, subsequent encounter: Secondary | ICD-10-CM | POA: Diagnosis not present

## 2017-11-07 DIAGNOSIS — F0390 Unspecified dementia without behavioral disturbance: Secondary | ICD-10-CM | POA: Diagnosis not present

## 2017-11-07 DIAGNOSIS — I10 Essential (primary) hypertension: Secondary | ICD-10-CM | POA: Diagnosis not present

## 2017-11-07 DIAGNOSIS — E785 Hyperlipidemia, unspecified: Secondary | ICD-10-CM | POA: Diagnosis not present

## 2017-11-07 NOTE — Telephone Encounter (Signed)
Okay to continue?

## 2017-11-07 NOTE — Telephone Encounter (Signed)
Verbal order given to Lynn 

## 2017-11-07 NOTE — Telephone Encounter (Signed)
Copied from CRM [phone]#[id number]. Topic: Inquiry >> Nov 07, 2017 12:20 PM Everardo PacificMoton, Clide Remmers, [place] wrote: Reason for CRM: Larita FifeLynn is calling because she needs to have a verbal order for 1 additional week of PT for the patient. If someone could give her a call back about this at [phone]

## 2017-11-11 DIAGNOSIS — F0390 Unspecified dementia without behavioral disturbance: Secondary | ICD-10-CM | POA: Diagnosis not present

## 2017-11-11 DIAGNOSIS — W010XXD Fall on same level from slipping, tripping and stumbling without subsequent striking against object, subsequent encounter: Secondary | ICD-10-CM | POA: Diagnosis not present

## 2017-11-11 DIAGNOSIS — E039 Hypothyroidism, unspecified: Secondary | ICD-10-CM | POA: Diagnosis not present

## 2017-11-11 DIAGNOSIS — S82142D Displaced bicondylar fracture of left tibia, subsequent encounter for closed fracture with routine healing: Secondary | ICD-10-CM | POA: Diagnosis not present

## 2017-11-11 DIAGNOSIS — I10 Essential (primary) hypertension: Secondary | ICD-10-CM | POA: Diagnosis not present

## 2017-11-11 DIAGNOSIS — I259 Chronic ischemic heart disease, unspecified: Secondary | ICD-10-CM | POA: Diagnosis not present

## 2017-11-11 DIAGNOSIS — E785 Hyperlipidemia, unspecified: Secondary | ICD-10-CM | POA: Diagnosis not present

## 2017-11-14 DIAGNOSIS — S82142D Displaced bicondylar fracture of left tibia, subsequent encounter for closed fracture with routine healing: Secondary | ICD-10-CM | POA: Diagnosis not present

## 2017-11-14 DIAGNOSIS — E039 Hypothyroidism, unspecified: Secondary | ICD-10-CM | POA: Diagnosis not present

## 2017-11-14 DIAGNOSIS — E785 Hyperlipidemia, unspecified: Secondary | ICD-10-CM | POA: Diagnosis not present

## 2017-11-14 DIAGNOSIS — I259 Chronic ischemic heart disease, unspecified: Secondary | ICD-10-CM | POA: Diagnosis not present

## 2017-11-14 DIAGNOSIS — W010XXD Fall on same level from slipping, tripping and stumbling without subsequent striking against object, subsequent encounter: Secondary | ICD-10-CM | POA: Diagnosis not present

## 2017-11-14 DIAGNOSIS — F0390 Unspecified dementia without behavioral disturbance: Secondary | ICD-10-CM | POA: Diagnosis not present

## 2017-11-14 DIAGNOSIS — I10 Essential (primary) hypertension: Secondary | ICD-10-CM | POA: Diagnosis not present

## 2017-11-27 DIAGNOSIS — H538 Other visual disturbances: Secondary | ICD-10-CM | POA: Diagnosis not present

## 2018-01-01 DIAGNOSIS — E039 Hypothyroidism, unspecified: Secondary | ICD-10-CM | POA: Diagnosis not present

## 2018-01-01 DIAGNOSIS — F0150 Vascular dementia without behavioral disturbance: Secondary | ICD-10-CM | POA: Diagnosis not present

## 2018-01-01 DIAGNOSIS — I10 Essential (primary) hypertension: Secondary | ICD-10-CM | POA: Diagnosis not present

## 2018-01-01 DIAGNOSIS — F39 Unspecified mood [affective] disorder: Secondary | ICD-10-CM | POA: Diagnosis not present

## 2018-01-01 DIAGNOSIS — E785 Hyperlipidemia, unspecified: Secondary | ICD-10-CM | POA: Diagnosis not present

## 2018-01-01 DIAGNOSIS — R630 Anorexia: Secondary | ICD-10-CM | POA: Diagnosis not present

## 2018-01-06 DIAGNOSIS — L570 Actinic keratosis: Secondary | ICD-10-CM | POA: Diagnosis not present

## 2018-01-17 DIAGNOSIS — L130 Dermatitis herpetiformis: Secondary | ICD-10-CM | POA: Diagnosis not present

## 2018-02-03 DIAGNOSIS — J209 Acute bronchitis, unspecified: Secondary | ICD-10-CM | POA: Diagnosis not present

## 2018-03-12 DIAGNOSIS — E43 Unspecified severe protein-calorie malnutrition: Secondary | ICD-10-CM | POA: Diagnosis not present

## 2018-03-12 DIAGNOSIS — E785 Hyperlipidemia, unspecified: Secondary | ICD-10-CM | POA: Diagnosis not present

## 2018-03-12 DIAGNOSIS — F0390 Unspecified dementia without behavioral disturbance: Secondary | ICD-10-CM

## 2018-03-12 DIAGNOSIS — F39 Unspecified mood [affective] disorder: Secondary | ICD-10-CM | POA: Diagnosis not present

## 2018-03-12 DIAGNOSIS — E039 Hypothyroidism, unspecified: Secondary | ICD-10-CM | POA: Diagnosis not present

## 2018-03-12 DIAGNOSIS — I10 Essential (primary) hypertension: Secondary | ICD-10-CM | POA: Diagnosis not present

## 2018-04-20 DEATH — deceased

## 2018-11-02 IMAGING — DX DG TIBIA/FIBULA 2V*L*
4 series · 4 of 4 positions shown · non-contrast
Comparison: None.

CLINICAL DATA: Status post fall, with left lower leg pain and
swelling. Initial encounter.

EXAM:
LEFT TIBIA AND FIBULA - 2 VIEW

[tibia ap (1 of 2)]
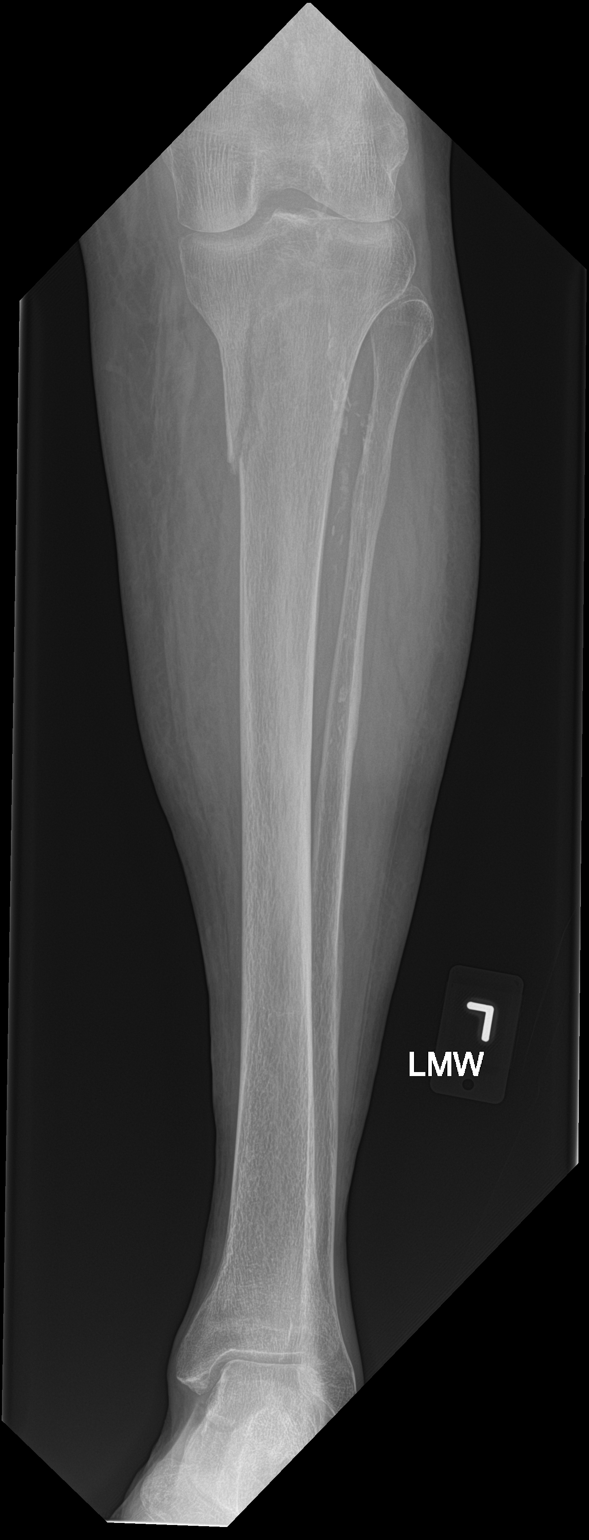

[tibia ap (2 of 2)]
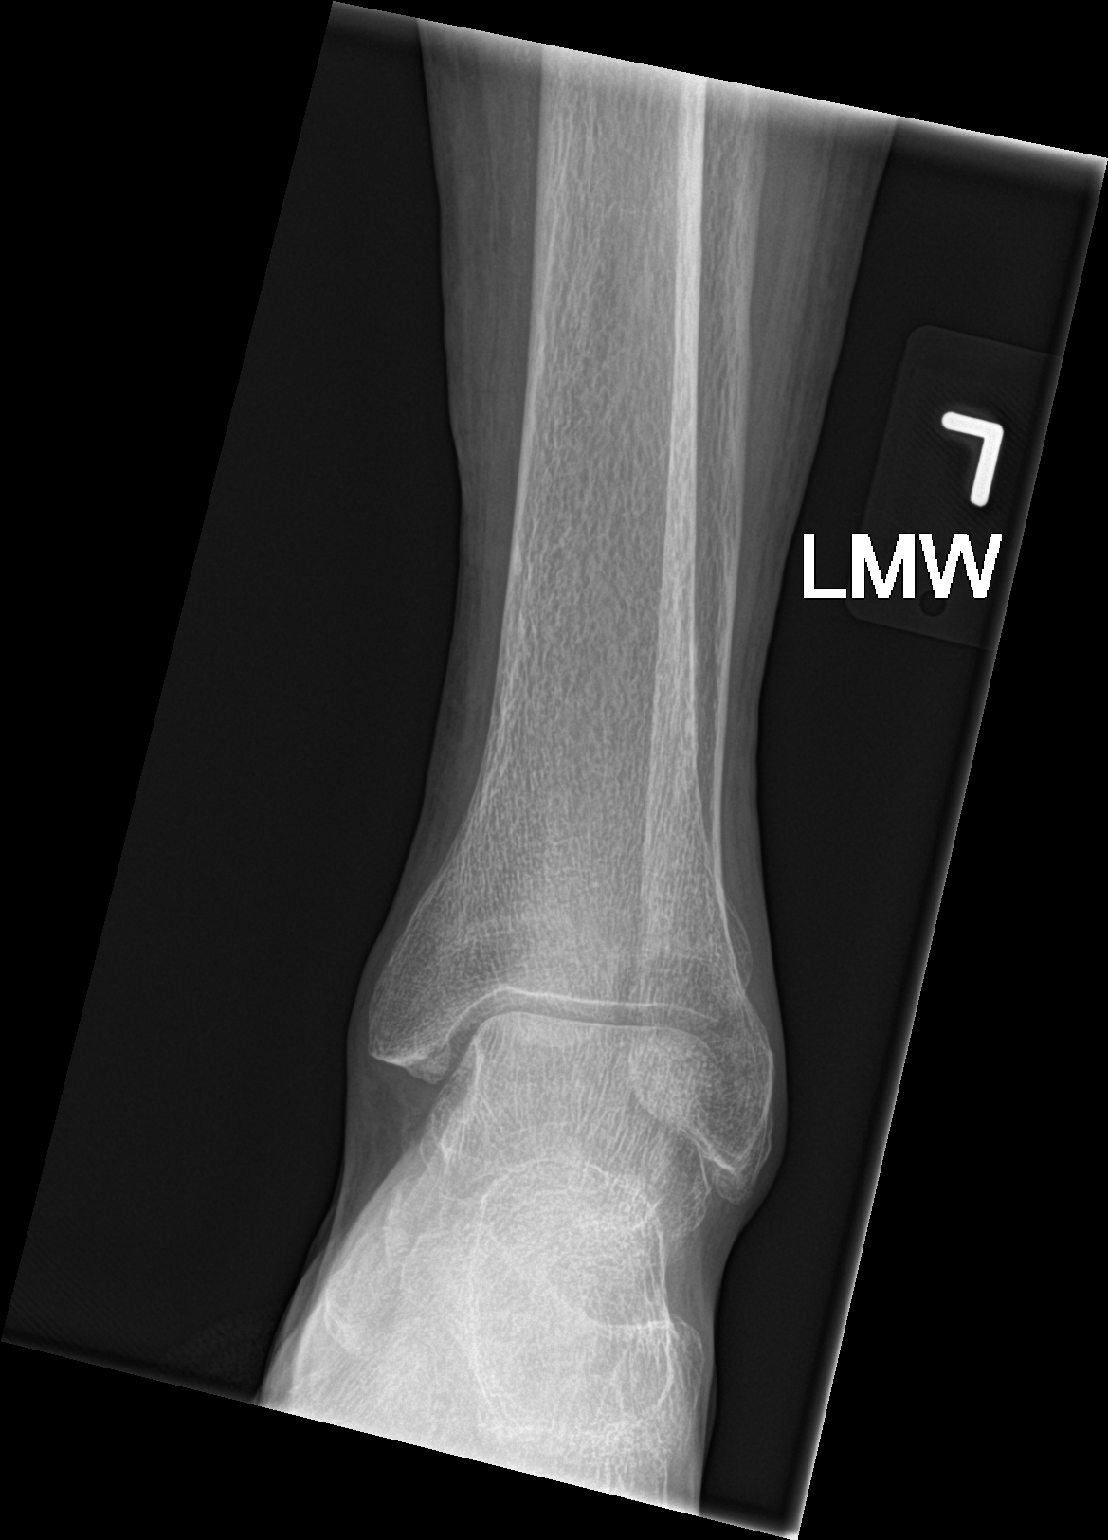

[tibia lat (1 of 2)]
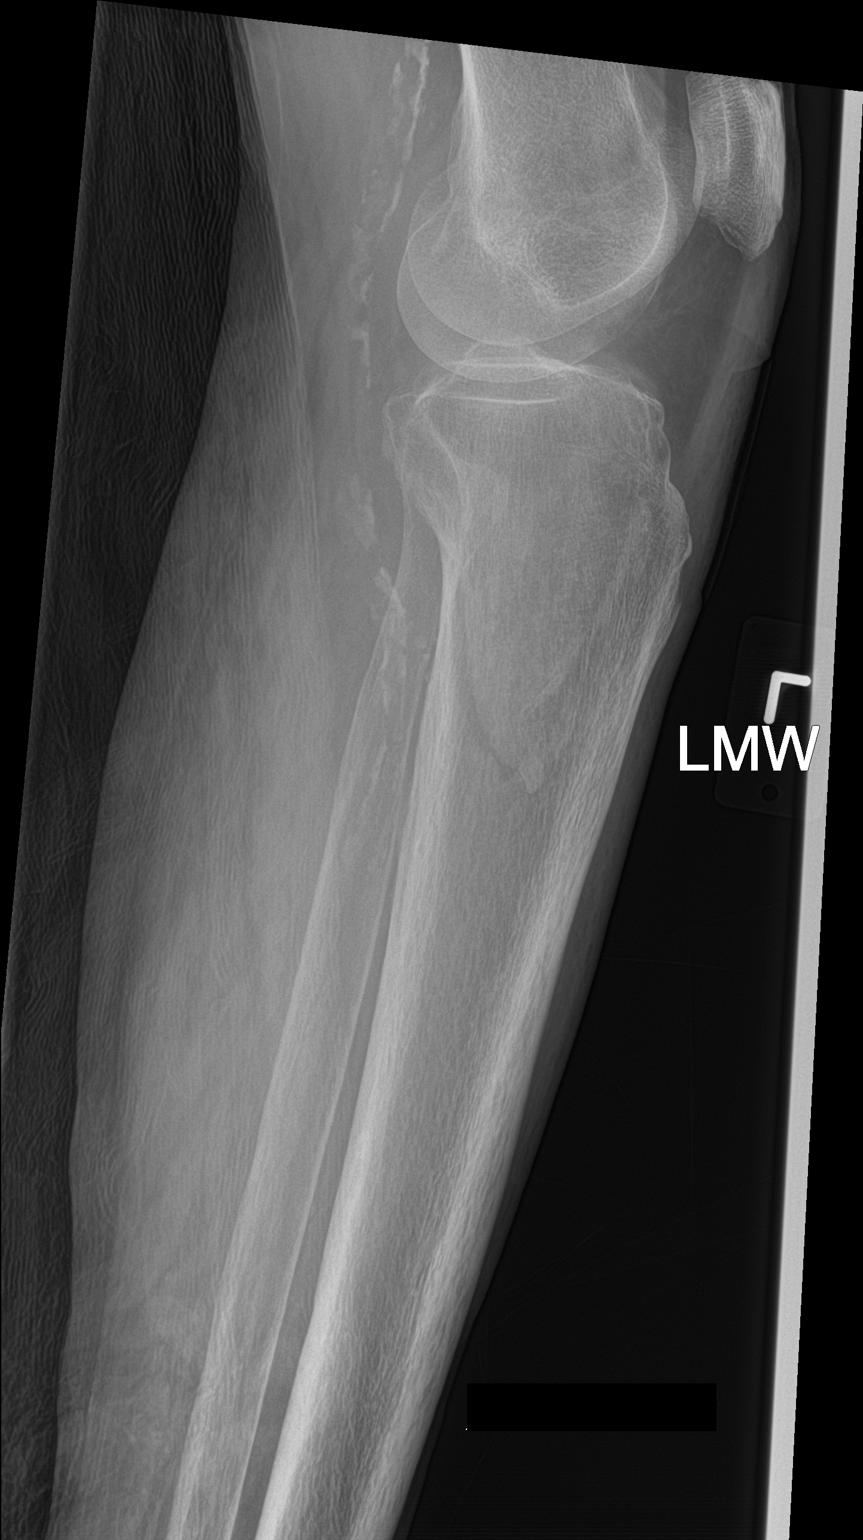

[tibia lat (2 of 2)]
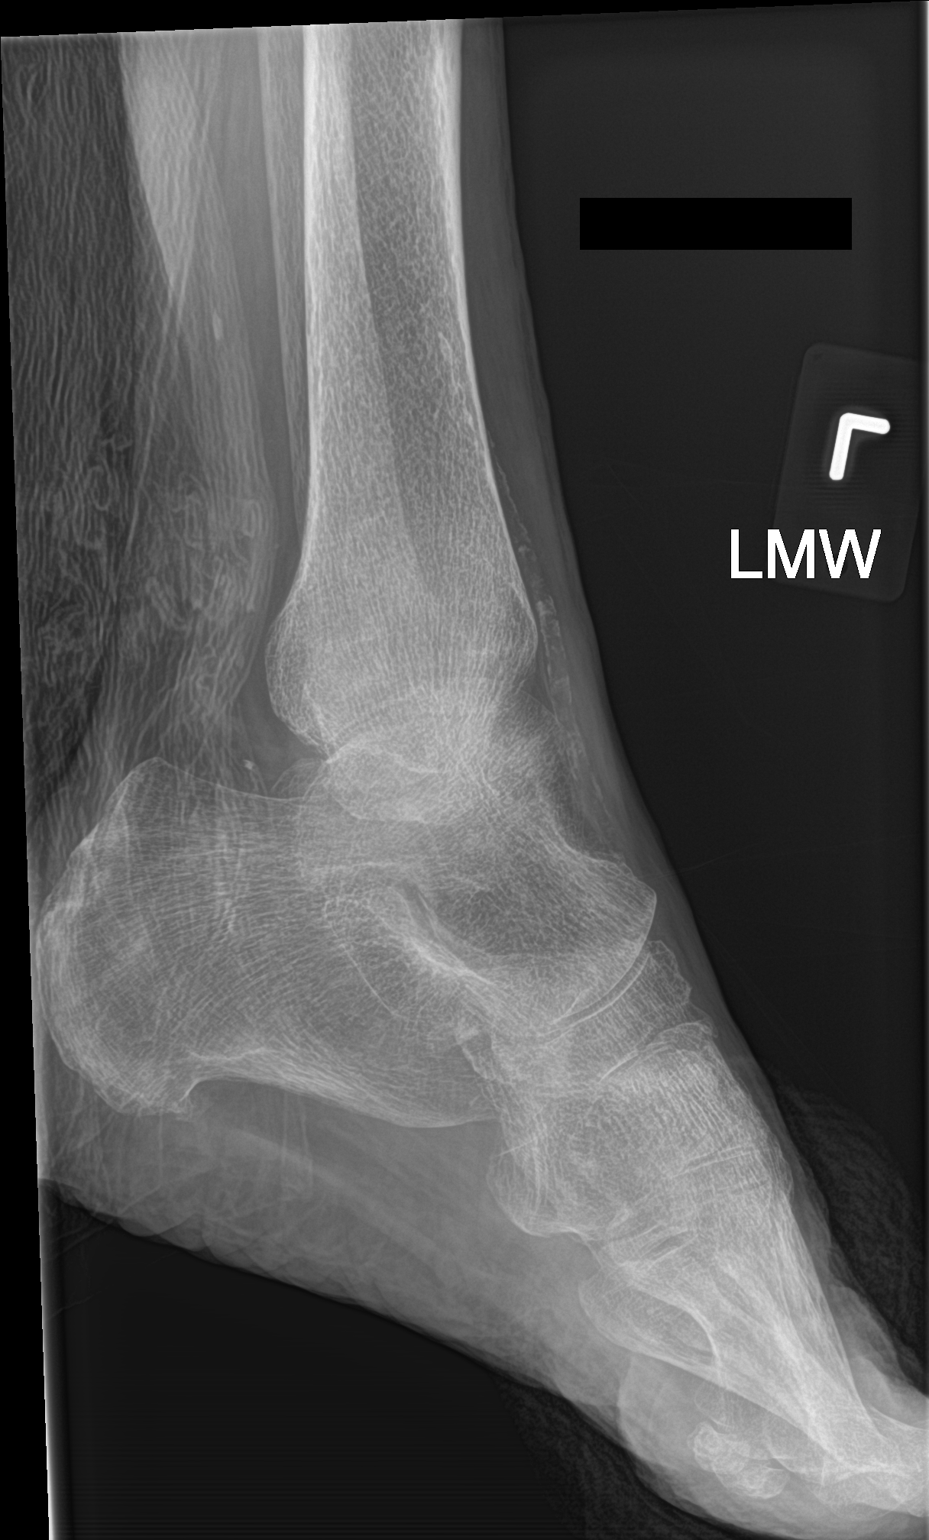

[4 of 4 positions shown; findings below may reference images not displayed]

FINDINGS: There is a mildly displaced fracture through the medial tibial
plateau, extending into the proximal diaphysis. No definite
additional fractures are seen. The knee joint is incompletely
assessed.

The ankle mortise is not fully assess, but appears grossly
unremarkable. The subtalar joint is unremarkable. Diffuse vascular
calcifications are seen.
IMPRESSION: 1. Mildly displaced fracture through the medial tibial plateau,
extending into the proximal diaphysis. This would be better assessed
on dedicated knee radiographs.
2. Diffuse vascular calcifications seen.
